# Patient Record
Sex: Male | Born: 1960 | Race: White | Hispanic: No | Marital: Married | Smoking: Never smoker
Health system: Southern US, Community
[De-identification: ages and names within clinical notes are randomized; demographics above are authoritative.]

## PROBLEM LIST (undated history)

## (undated) DIAGNOSIS — M1990 Unspecified osteoarthritis, unspecified site: Secondary | ICD-10-CM

## (undated) DIAGNOSIS — K219 Gastro-esophageal reflux disease without esophagitis: Secondary | ICD-10-CM

## (undated) DIAGNOSIS — T7840XA Allergy, unspecified, initial encounter: Secondary | ICD-10-CM

## (undated) DIAGNOSIS — G510 Bell's palsy: Secondary | ICD-10-CM

## (undated) DIAGNOSIS — K449 Diaphragmatic hernia without obstruction or gangrene: Secondary | ICD-10-CM

## (undated) DIAGNOSIS — Z9289 Personal history of other medical treatment: Secondary | ICD-10-CM

## (undated) DIAGNOSIS — D649 Anemia, unspecified: Secondary | ICD-10-CM

## (undated) HISTORY — DX: Gastro-esophageal reflux disease without esophagitis: K21.9

## (undated) HISTORY — DX: Allergy, unspecified, initial encounter: T78.40XA

## (undated) HISTORY — DX: Unspecified osteoarthritis, unspecified site: M19.90

## (undated) HISTORY — DX: Diaphragmatic hernia without obstruction or gangrene: K44.9

## (undated) HISTORY — PX: WISDOM TOOTH EXTRACTION: SHX21

## (undated) HISTORY — PX: COLONOSCOPY: SHX174

## (undated) HISTORY — DX: Bell's palsy: G51.0

## (undated) HISTORY — DX: Personal history of other medical treatment: Z92.89

---

## 2012-04-06 ENCOUNTER — Ambulatory Visit (INDEPENDENT_AMBULATORY_CARE_PROVIDER_SITE_OTHER): Payer: BC Managed Care – PPO | Admitting: Emergency Medicine

## 2012-04-06 VITALS — BP 142/75 | HR 66 | Temp 98.2°F | Resp 16 | Ht 74.4 in | Wt 304.0 lb

## 2012-04-06 DIAGNOSIS — M2390 Unspecified internal derangement of unspecified knee: Secondary | ICD-10-CM

## 2012-04-06 MED ORDER — NAPROXEN SODIUM 550 MG PO TABS: 550.0000 mg | ORAL_TABLET | Freq: Two times a day (BID) | ORAL | Status: AC

## 2012-04-06 MED ORDER — TRAMADOL HCL 50 MG PO TABS
50.0000 mg | ORAL_TABLET | Freq: Three times a day (TID) | ORAL | Status: AC | PRN
Start: 2012-04-06 — End: 2012-04-16

## 2012-04-06 NOTE — Progress Notes (Signed)
  Subjective:    Patient ID: Jeffery Phelps, male    DOB: May 11, 1961, 51 y.o.   MRN: [id number]  Knee Pain  The incident occurred 3 to 5 days ago. The incident occurred at home. The injury mechanism was a twisting injury. The pain is present in the left knee. The quality of the pain is described as aching and burning. The pain is at a severity of 4/10. The pain is moderate. The pain has been improving since onset. Associated symptoms include an inability to bear weight and a loss of motion.      Review of Systems  Constitutional: Negative.   HENT: Negative.   Eyes: Negative.   Respiratory: Negative.   Cardiovascular: Negative.   Gastrointestinal: Negative.   Genitourinary: Negative.   Musculoskeletal: Positive for joint swelling.  Skin: Negative.   Neurological: Negative.        Objective:   Physical Exam  Constitutional: He is oriented to person, place, and time. He appears well-developed and well-nourished.  HENT:  Head: Normocephalic and atraumatic.  Eyes: EOM are normal. Pupils are equal, round, and reactive to light.  Neck: Normal range of motion.  Cardiovascular: Normal rate and regular rhythm.   Pulmonary/Chest: Effort normal.  Musculoskeletal: He exhibits tenderness (knee full ROM with some pain and effusion. no change with valgus or varus stress.   pain in lateral meniscus).  Neurological: He is alert and oriented to person, place, and time.  Skin: Skin is warm and dry.          Assessment & Plan:

## 2012-04-06 NOTE — Patient Instructions (Signed)
Knee Sprain  You have a knee sprain. Sprains are painful injuries to the joints. A sprain is a partial or complete tearing of ligaments. Ligaments are tough, fibrous tissues that hold bones together at the joints. A strain (sprain) has occurred when a ligament is stretched or damaged. This injury may take several weeks to heal. This is often the same length of time as a bone fracture (break in bone) takes to heal. Even though a fracture (bone break) may not have occurred, the recovery times may be similar.  HOME CARE INSTRUCTIONS   · Rest the injured area for as long as directed by your caregiver. Then slowly start using the joint as directed by your caregiver and as the pain allows. Use crutches as directed. If the knee was splinted or casted, continue use and care as directed. If an ace bandage has been applied today, it should be removed and reapplied every 3 to 4 hours. It should not be applied tightly, but firmly enough to keep swelling down. Watch toes and feet for swelling, bluish discoloration, coldness, numbness or excessive pain. If any of these symptoms occur, remove the ace bandage and reapply more loosely.If these symptoms persist, seek medical attention.  · For the first 24 hours, lie down. Keep the injured extremity elevated on two pillows.  · Apply ice to the injured area for 15 to 20 minutes every couple hours. Repeat this 3 to 4 times per day for the first 48 hours. Put the ice in a plastic bag and place a towel between the bag of ice and your skin.  · Wear any splinting, casting, or elastic bandage applications as instructed.  · Only take over-the-counter or prescription medicines for pain, discomfort, or fever as directed by your caregiver. Do not use aspirin immediately after the injury unless instructed by your caregiver. Aspirin can cause increased bleeding and bruising of the tissues.  · If you were given crutches, continue to use them as instructed. Do not resume weight bearing on the  affected extremity until instructed.  Persistent pain and inability to use the injured area as directed for more than 2 to 3 days are warning signs. If this happens you should see a caregiver for a follow-up visit as soon as possible. Initially, a hairline fracture (this is the same as a broken bone) may not be evident on x-rays. Persistent pain and swelling indicate that further evaluation, non-weight bearing (use of crutches as instructed), and/or further x-rays are indicated. X-rays may sometimes not show a small fracture until a week or ten days later. Make a follow-up appointment with your own caregiver or one to whom we have referred you. A radiologist (specialist in reading x-rays) may re-read your X-rays. Make sure you know how you are to get your x-ray results. Do not assume everything is normal if you do not hear from us.  SEEK MEDICAL CARE IF:   · Bruising, swelling, or pain increases.  · You have cold or numb toes  · You have continuing difficulty or pain with walking.  SEEK IMMEDIATE MEDICAL CARE IF:   · Your toes are cold, numb or blue.  · The pain is not responding to medications and continues to stay the same or get worse.  MAKE SURE YOU:   · Understand these instructions.  · Will watch your condition.  · Will get help right away if you are not doing well or get worse.  Document Released: 10/14/2005 Document Revised: 10/03/2011 Document Reviewed: 09/28/2007    ExitCare® Patient Information ©2012 [employer].

## 2012-04-08 ENCOUNTER — Inpatient Hospital Stay (HOSPITAL_COMMUNITY): Admission: RE | Admit: 2012-04-08 | Payer: Self-pay | Source: Ambulatory Visit

## 2012-04-10 NOTE — Addendum Note (Signed)
Addended by: Carmelina Dane on: 04/10/2012 01:11 PM   Modules accepted: Orders

## 2013-04-09 ENCOUNTER — Ambulatory Visit (INDEPENDENT_AMBULATORY_CARE_PROVIDER_SITE_OTHER): Payer: BC Managed Care – PPO | Admitting: Internal Medicine

## 2013-04-09 VITALS — BP 138/70 | HR 79 | Temp 97.8°F | Resp 16 | Ht 74.5 in | Wt 312.0 lb

## 2013-04-09 DIAGNOSIS — G510 Bell's palsy: Secondary | ICD-10-CM

## 2013-04-09 DIAGNOSIS — R29810 Facial weakness: Secondary | ICD-10-CM

## 2013-04-09 DIAGNOSIS — E669 Obesity, unspecified: Secondary | ICD-10-CM

## 2013-04-09 LAB — POCT CBC
Granulocyte percent: 70.8 %G (ref 37–80)
Hemoglobin: 12 g/dL — AB (ref 14.1–18.1)
MCV: 95 fL (ref 80–97)
MID (cbc): 0.3 (ref 0–0.9)
MPV: 9.2 fL (ref 0–99.8)
POC MID %: 6.1 %M (ref 0–12)
Platelet Count, POC: 163 10*3/uL (ref 142–424)
RBC: 4.18 M/uL — AB (ref 4.69–6.13)

## 2013-04-09 LAB — COMPREHENSIVE METABOLIC PANEL
CO2: 25 mEq/L (ref 19–32)
Calcium: 9.1 mg/dL (ref 8.4–10.5)
Chloride: 106 mEq/L (ref 96–112)
Glucose, Bld: 131 mg/dL — ABNORMAL HIGH (ref 70–99)
Sodium: 139 mEq/L (ref 135–145)
Total Bilirubin: 0.5 mg/dL (ref 0.3–1.2)
Total Protein: 6.9 g/dL (ref 6.0–8.3)

## 2013-04-09 LAB — PSA: PSA: 0.26 ng/mL (ref <=4.00)

## 2013-04-09 LAB — LIPID PANEL: HDL: 48 mg/dL (ref >39)

## 2013-04-09 LAB — TSH: TSH: 0.829 u[IU]/mL (ref 0.350–4.500)

## 2013-04-09 MED ORDER — PREDNISONE 20 MG PO TABS: ORAL_TABLET | ORAL | Status: DC

## 2013-04-09 MED ORDER — VALACYCLOVIR HCL 1 G PO TABS
1000.0000 mg | ORAL_TABLET | Freq: Three times a day (TID) | ORAL | Status: DC
Start: 2013-04-09 — End: 2014-01-26

## 2013-04-09 NOTE — Patient Instructions (Signed)
Bell's Palsy  Bell's palsy is a condition in which the muscles on one side of the face cannot move (paralysis). This is because the nerves in the face are paralyzed. It is most often thought to be caused by a virus. The virus causes swelling of the nerve that controls movement on one side of the face. The nerve travels through a tight space surrounded by bone. When the nerve swells, it can be compressed by the bone. This results in damage to the protective covering around the nerve. This damage interferes with how the nerve communicates with the muscles of the face. As a result, it can cause weakness or paralysis of the facial muscles.   Injury (trauma), tumor, and surgery may cause Bell's palsy, but most of the time the cause is unknown. It is a relatively common condition. It starts suddenly (abrupt onset) with the paralysis usually ending within 2 days. Bell's palsy is not dangerous. But because the eye does not close properly, you may need care to keep the eye from getting dry. This can include splinting (to keep the eye shut) or moistening with artificial tears. Bell's palsy very seldom occurs on both sides of the face at the same time.  SYMPTOMS    Eyebrow sagging.   Drooping of the eyelid and corner of the mouth.   Inability to close one eye.   Loss of taste on the front of the tongue.   Sensitivity to loud noises.  TREATMENT   The treatment is usually non-surgical. If the patient is seen within the first 24 to 48 hours, a short course of steroids may be prescribed, in an attempt to shorten the length of the condition. Antiviral medicines may also be used with the steroids, but it is unclear if they are helpful.   You will need to protect your eye, if you cannot close it. The cornea (clear covering over your eye) will become dry and can be damaged. Artificial tears can be used to keep your eye moist. Glasses or an eye patch should be worn to protect your eye.  PROGNOSIS   Recovery is variable, ranging  from days to months. Although the problem usually goes away completely (about 80% of cases resolve), predicting the outcome is impossible. Most people improve within 3 weeks of when the symptoms began. Improvement may continue for 3 to 6 months. A small number of people have moderate to severe weakness that is permanent.   HOME CARE INSTRUCTIONS    If your caregiver prescribed medication to reduce swelling in the nerve, use as directed. Do not stop taking the medication unless directed by your caregiver.   Use moisturizing eye drops as needed to prevent drying of your eye, as directed by your caregiver.   Protect your eye, as directed by your caregiver.   Use facial massage and exercises, as directed by your caregiver.   Perform your normal activities, and get your normal rest.  SEEK IMMEDIATE MEDICAL CARE IF:    There is pain, redness or irritation in the eye.   You or your child has an oral temperature above 102 F (38.9 C), not controlled by medicine.  MAKE SURE YOU:    Understand these instructions.   Will watch your condition.   Will get help right away if you are not doing well or get worse.  Document Released: 10/14/2005 Document Revised: 01/06/2012 Document Reviewed: 10/23/2009  ExitCare Patient Information 2014 [employer].

## 2013-04-09 NOTE — Progress Notes (Signed)
Subjective:    Patient ID: Jeffery Phelps, male    DOB: [birth year], 52 y.o.   MRN: [id number]  HPI Patient states that yesterday around noon the right side of his face stopped working, facial dropping.  Right ear also feels congested.  Patient also complaining of left eye twitches off and on since yesterday afternoon.  Denies SOB, Chest pain, no balance problems. Denies hearing changes, pain, dizziness.  No history of heart problems or heart irregularity.  Patient does not have a cardiologist.  Patient does not smoke, denies any medical conditions.   Has no MD and no hx of complete exam or any lab work done. Is morbid obese. No hx of DM, HTN ,or cholesterol issues.  Patient had a slight headache yesterday.   Review of Systems Arthritis and allergys    Objective:   Physical Exam  Vitals reviewed. Constitutional: He is oriented to person, place, and time. He appears well-nourished. No distress.  HENT:  Right Ear: External ear normal.  Left Ear: External ear normal.  Nose: Nose normal.  Mouth/Throat: Oropharynx is clear and moist.  Eyes: Conjunctivae and EOM are normal. Pupils are equal, round, and reactive to light.  Neck: Normal range of motion. Neck supple. No tracheal deviation present. No thyromegaly present.  Cardiovascular: Normal rate, normal heart sounds and intact distal pulses.   Pulmonary/Chest: Effort normal and breath sounds normal.  Abdominal: Soft. Bowel sounds are normal. There is no tenderness.  Musculoskeletal: Normal range of motion.  Lymphadenopathy:    He has no cervical adenopathy.  Neurological: He is alert and oriented to person, place, and time. He has normal reflexes. He displays normal reflexes. A cranial nerve deficit is present. No sensory deficit. He exhibits abnormal muscle tone. He displays a negative Romberg sign. Coordination and gait normal. He displays no Babinski's sign on the right side. He displays no Babinski's sign on the left side.  Reflex  Scores:      Tricep reflexes are 2+ on the right side and 2+ on the left side.      Patellar reflexes are 2+ on the right side and 2+ on the left side. Balance intact No drift  Skin: No rash noted.  Psychiatric: He has a normal mood and affect. His behavior is normal. Judgment and thought content normal.   Right facial weakness obvious  EKG normal Results for orders placed in visit on 04/09/13  POCT CBC      Result Value Range   WBC 4.4 (*) 4.6 - 10.2 K/uL   Lymph, poc 1.0  0.6 - 3.4   POC LYMPH PERCENT 23.1  10 - 50 %L   MID (cbc) 0.3  0 - 0.9   POC MID % 6.1  0 - 12 %M   POC Granulocyte 3.1  2 - 6.9   Granulocyte percent 70.8  37 - 80 %G   RBC 4.18 (*) 4.69 - 6.13 M/uL   Hemoglobin 12.0 (*) 14.1 - 18.1 g/dL   HCT, POC 40.9 (*) 81.1 - 53.7 %   MCV 95.0  80 - 97 fL   MCH, POC 28.7  27 - 31.2 pg   MCHC 30.2 (*) 31.8 - 35.4 g/dL   RDW, POC 91.4     Platelet Count, POC 163  142 - 424 K/uL   MPV 9.2  0 - 99.8 fL  GLUCOSE, POCT (MANUAL RESULT ENTRY)      Result Value Range   POC Glucose 124 (*) 70 - 99 mg/dl  POCT GLYCOSYLATED HEMOGLOBIN (HGB A1C)      Result Value Range   Hemoglobin A1C 5.5         Assessment & Plan:  Acute Bells Palsy Start Prednisone and valtrex EYE care/patch/lacrlube Refer to ENT

## 2014-01-10 ENCOUNTER — Ambulatory Visit: Payer: BC Managed Care – PPO

## 2014-01-10 ENCOUNTER — Ambulatory Visit (INDEPENDENT_AMBULATORY_CARE_PROVIDER_SITE_OTHER): Payer: BC Managed Care – PPO | Admitting: Emergency Medicine

## 2014-01-10 VITALS — BP 140/62 | HR 74 | Temp 98.0°F | Resp 16 | Ht 72.0 in | Wt 309.0 lb

## 2014-01-10 DIAGNOSIS — Z1211 Encounter for screening for malignant neoplasm of colon: Secondary | ICD-10-CM

## 2014-01-10 DIAGNOSIS — R079 Chest pain, unspecified: Secondary | ICD-10-CM

## 2014-01-10 DIAGNOSIS — S20219A Contusion of unspecified front wall of thorax, initial encounter: Secondary | ICD-10-CM

## 2014-01-10 MED ORDER — ACETAMINOPHEN-CODEINE #3 300-30 MG PO TABS: 1.0000 | ORAL_TABLET | ORAL | Status: DC | PRN
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
## 2014-01-10 NOTE — Patient Instructions (Signed)
Chest Contusion °A chest contusion is a deep bruise on your chest area. Contusions are the result of an injury that caused bleeding under the skin. A chest contusion may involve bruising of the skin, muscles, or ribs. The contusion may turn blue, purple, or yellow. Minor injuries will give you a painless contusion, but more severe contusions may stay painful and swollen for a few weeks. °CAUSES  °A contusion is usually caused by a blow, trauma, or direct force to an area of the body. °SYMPTOMS  °· Swelling and redness of the injured area. °· Discoloration of the injured area. °· Tenderness and soreness of the injured area. °· Pain. °DIAGNOSIS  °The diagnosis can be made by taking a history and performing a physical exam. An X-ray, CT scan, or MRI may be needed to determine if there were any associated injuries, such as broken bones (fractures) or internal injuries. °TREATMENT  °Often, the best treatment for a chest contusion is resting, icing, and applying cold compresses to the injured area. Deep breathing exercises may be recommended to reduce the risk of pneumonia. Over-the-counter medicines may also be recommended for pain control. °HOME CARE INSTRUCTIONS  °· Put ice on the injured area. °· Put ice in a plastic bag. °· Place a towel between your skin and the bag. °· Leave the ice on for 15-20 minutes, 03-04 times a day. °· Only take over-the-counter or prescription medicines as directed by your caregiver. Your caregiver may recommend avoiding anti-inflammatory medicines (aspirin, ibuprofen, and naproxen) for 48 hours because these medicines may increase bruising. °· Rest the injured area. °· Perform deep-breathing exercises as directed by your caregiver. °· Stop smoking if you smoke. °· Do not lift objects over 5 pounds (2.3 kg) for 3 days or longer if recommended by your caregiver. °SEEK IMMEDIATE MEDICAL CARE IF:  °· You have increased bruising or swelling. °· You have pain that is getting worse. °· You have  difficulty breathing. °· You have dizziness, weakness, or fainting. °· You have blood in your urine or stool. °· You cough up or vomit blood. °· Your swelling or pain is not relieved with medicines. °MAKE SURE YOU:  °· Understand these instructions. °· Will watch your condition. °· Will get help right away if you are not doing well or get worse. °Document Released: 07/09/2001 Document Revised: 07/08/2012 Document Reviewed: 04/06/2012 °ExitCare® Patient Information ©2014 [employer]. ° °

## 2014-01-12 ENCOUNTER — Encounter: Payer: Self-pay | Admitting: Internal Medicine

## 2014-01-19 ENCOUNTER — Telehealth: Payer: Self-pay
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
## 2014-01-19 NOTE — Telephone Encounter (Signed)
Spoke to pts wife, wife wants him to seek additional tx, side has begun hurting a couple of days later, wifes opinion - she wants dr Ouida Sills to persuade him to have further testing.  i advised her to bring her husband in for further evaluation due to new pain in side. Dr. Sharyon Cable hours were given to pts wife.  I instructed her to discuss her concerns with the doctor at that time.

## 2014-01-20 ENCOUNTER — Ambulatory Visit (INDEPENDENT_AMBULATORY_CARE_PROVIDER_SITE_OTHER): Payer: BC Managed Care – PPO | Admitting: Internal Medicine

## 2014-01-20 VITALS — BP 144/78 | HR 73 | Temp 98.4°F | Resp 18 | Ht 72.0 in | Wt 306.0 lb

## 2014-01-20 DIAGNOSIS — R079 Chest pain, unspecified: Secondary | ICD-10-CM

## 2014-01-20 DIAGNOSIS — M546 Pain in thoracic spine: Secondary | ICD-10-CM

## 2014-01-20 DIAGNOSIS — S20219A Contusion of unspecified front wall of thorax, initial encounter: Secondary | ICD-10-CM

## 2014-01-20 DIAGNOSIS — Z1211 Encounter for screening for malignant neoplasm of colon: Secondary | ICD-10-CM

## 2014-01-20 NOTE — Patient Instructions (Signed)
Chest Contusion °A chest contusion is a deep bruise on your chest area. Contusions are the result of an injury that caused bleeding under the skin. A chest contusion may involve bruising of the skin, muscles, or ribs. The contusion may turn blue, purple, or yellow. Minor injuries will give you a painless contusion, but more severe contusions may stay painful and swollen for a few weeks. °CAUSES  °A contusion is usually caused by a blow, trauma, or direct force to an area of the body. °SYMPTOMS  °· Swelling and redness of the injured area. °· Discoloration of the injured area. °· Tenderness and soreness of the injured area. °· Pain. °DIAGNOSIS  °The diagnosis can be made by taking a history and performing a physical exam. An X-ray, CT scan, or MRI may be needed to determine if there were any associated injuries, such as broken bones (fractures) or internal injuries. °TREATMENT  °Often, the best treatment for a chest contusion is resting, icing, and applying cold compresses to the injured area. Deep breathing exercises may be recommended to reduce the risk of pneumonia. Over-the-counter medicines may also be recommended for pain control. °HOME CARE INSTRUCTIONS  °· Put ice on the injured area. °· Put ice in a plastic bag. °· Place a towel between your skin and the bag. °· Leave the ice on for 15-20 minutes, 03-04 times a day. °· Only take over-the-counter or prescription medicines as directed by your caregiver. Your caregiver may recommend avoiding anti-inflammatory medicines (aspirin, ibuprofen, and naproxen) for 48 hours because these medicines may increase bruising. °· Rest the injured area. °· Perform deep-breathing exercises as directed by your caregiver. °· Stop smoking if you smoke. °· Do not lift objects over 5 pounds (2.3 kg) for 3 days or longer if recommended by your caregiver. °SEEK IMMEDIATE MEDICAL CARE IF:  °· You have increased bruising or swelling. °· You have pain that is getting worse. °· You have  difficulty breathing. °· You have dizziness, weakness, or fainting. °· You have blood in your urine or stool. °· You cough up or vomit blood. °· Your swelling or pain is not relieved with medicines. °MAKE SURE YOU:  °· Understand these instructions. °· Will watch your condition. °· Will get help right away if you are not doing well or get worse. °Document Released: 07/09/2001 Document Revised: 07/08/2012 Document Reviewed: 04/06/2012 °ExitCare® Patient Information ©2014 [employer]. ° °
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
## 2014-01-26 ENCOUNTER — Ambulatory Visit (AMBULATORY_SURGERY_CENTER): Payer: Self-pay

## 2014-01-26 VITALS — Ht 72.0 in | Wt 310.0 lb

## 2014-01-26 DIAGNOSIS — Z800 Family history of malignant neoplasm of digestive organs: Secondary | ICD-10-CM

## 2014-01-26 MED ORDER — SUPREP BOWEL PREP KIT 17.5-3.13-1.6 GM/177ML PO SOLN: 1.0000 | Freq: Once | ORAL | Status: DC

## 2014-01-28 ENCOUNTER — Encounter: Payer: Self-pay | Admitting: Internal Medicine

## 2014-02-09 ENCOUNTER — Encounter: Payer: Self-pay | Admitting: Internal Medicine

## 2014-02-17 ENCOUNTER — Encounter: Payer: Self-pay | Admitting: Internal Medicine

## 2014-02-17 ENCOUNTER — Ambulatory Visit (AMBULATORY_SURGERY_CENTER): Payer: BC Managed Care – PPO | Admitting: Internal Medicine

## 2014-02-17 VITALS — BP 94/49 | HR 53 | Temp 97.4°F | Resp 17 | Ht 72.0 in | Wt 310.0 lb

## 2014-02-17 DIAGNOSIS — D126 Benign neoplasm of colon, unspecified: Secondary | ICD-10-CM

## 2014-02-17 DIAGNOSIS — Z800 Family history of malignant neoplasm of digestive organs: Secondary | ICD-10-CM

## 2014-02-17 DIAGNOSIS — Z1211 Encounter for screening for malignant neoplasm of colon: Secondary | ICD-10-CM

## 2014-02-17 MED ORDER — SODIUM CHLORIDE 0.9 % IV SOLN: 500.0000 mL | INTRAVENOUS | Status: DC

## 2014-02-17 NOTE — Op Note (Signed)
[place]  [address] [place], [address]   COLONOSCOPY PROCEDURE REPORT  PATIENT: Jeffery Phelps, Jeffery Phelps  MR#: [id number] BIRTHDATE: [birth year] , 53  yrs. old GENDER: Male ENDOSCOPIST: Gatha Mayer, MD, [facility] REFERRED BY:   Synetta Shadow, M.D. PROCEDURE DATE:  02/17/2014 PROCEDURE:   Colonoscopy with biopsy First Screening Colonoscopy - Avg.  risk and is 50 yrs.  old or older - No.  Prior Negative Screening - Now for repeat screening. N/A  History of Adenoma - Now for follow-up colonoscopy & has been > or = to 3 yrs.  N/A  Polyps Removed Today? Yes. ASA CLASS:   Class II INDICATIONS:elevated risk screening and Patient's immediate family history of colon cancer.   Mom age 93 MEDICATIONS: propofol (Diprivan) 250mg  IV, MAC sedation, administered by CRNA, and These medications were titrated to patient response per physician's verbal order  DESCRIPTION OF PROCEDURE:   After the risks benefits and alternatives of the procedure were thoroughly explained, informed consent was obtained.  A digital rectal exam revealed no abnormalities of the rectum, A digital rectal exam revealed no prostatic nodules, and A digital rectal exam revealed the prostate was not enlarged.   The [id number]  endoscope was introduced through the anus and advanced to the cecum, which was identified by both the appendix and ileocecal valve. No adverse events experienced.   The quality of the prep was excellent using Suprep  The instrument was then slowly withdrawn as the colon was fully examined.      COLON FINDINGS: A sessile polyp measuring 2 mm in size was found in the descending colon.  A polypectomy was performed with cold forceps.  The resection was complete and the polyp tissue was completely retrieved.   The colon mucosa was otherwise normal. Retroflexed views revealed no abnormalities. The time to cecum=2 minutes 54 seconds.  Withdrawal time=9 minutes 0 seconds.   The scope was withdrawn and the procedure completed. COMPLICATIONS: There were no complications.  ENDOSCOPIC IMPRESSION: 1.   Sessile polyp measuring 2 mm in size was found in the descending colon; polypectomy was performed with cold forceps 2.   The colon mucosa was otherwise normal - excellent prep  RECOMMENDATIONS: Repeat Colonoscopy in 5 years anticipated (FHx CRCA)   eSigned:  Gatha Mayer, MD, [facility] 02/17/2014 9:56 AM   cc: Synetta Shadow, MD and The Patient

## 2014-02-17 NOTE — Patient Instructions (Addendum)
One tiny polyp was removed - it looks benign.  Everything else was normal.Suspect next colonoscopy in 5 years.  I will let you know pathology results and when to have another routine colonoscopy by mail.  I appreciate the opportunity to care for you. Gatha Mayer, MD, FACG  YOU HAD AN ENDOSCOPIC PROCEDURE TODAY AT [facility]: Refer to the procedure report that was given to you for any specific questions about what was found during the examination.  If the procedure report does not answer your questions, please call your gastroenterologist to clarify.  If you requested that your care partner not be given the details of your procedure findings, then the procedure report has been included in a sealed envelope for you to review at your convenience later.  YOU SHOULD EXPECT: Some feelings of bloating in the abdomen. Passage of more gas than usual.  Walking can help get rid of the air that was put into your GI tract during the procedure and reduce the bloating. If you had a lower endoscopy (such as a colonoscopy or flexible sigmoidoscopy) you may notice spotting of blood in your stool or on the toilet paper. If you underwent a bowel prep for your procedure, then you may not have a normal bowel movement for a few days.  DIET: Your first meal following the procedure should be a light meal and then it is ok to progress to your normal diet.  A half-sandwich or bowl of soup is an example of a good first meal.  Heavy or fried foods are harder to digest and may make you feel nauseous or bloated.  Likewise meals heavy in dairy and vegetables can cause extra gas to form and this can also increase the bloating.  Drink plenty of fluids but you should avoid alcoholic beverages for 24 hours.  ACTIVITY: Your care partner should take you home directly after the procedure.  You should plan to take it easy, moving slowly for the rest of the day.  You can resume normal activity the day after the  procedure however you should NOT DRIVE or use heavy machinery for 24 hours (because of the sedation medicines used during the test).    SYMPTOMS TO REPORT IMMEDIATELY: A gastroenterologist can be reached at any hour.  During normal business hours, 8:30 AM to 5:00 PM Monday through Friday, call [phone].  After hours and on weekends, please call the GI answering service at [phone] who will take a message and have the physician on call contact you.   Following lower endoscopy (colonoscopy or flexible sigmoidoscopy):  Excessive amounts of blood in the stool  Significant tenderness or worsening of abdominal pains  Swelling of the abdomen that is new, acute  Fever of 100F or higher  FOLLOW UP: If any biopsies were taken you will be contacted by phone or by letter within the next 1-3 weeks.  Call your gastroenterologist if you have not heard about the biopsies in 3 weeks.  Our staff will call the home number listed on your records the next business day following your procedure to check on you and address any questions or concerns that you may have at that time regarding the information given to you following your procedure. This is a courtesy call and so if there is no answer at the home number and we have not heard from you through the emergency physician on call, we will assume that you have returned to your regular daily activities without  incident.  SIGNATURES/CONFIDENTIALITY: You and/or your care partner have signed paperwork which will be entered into your electronic medical record.  These signatures attest to the fact that that the information above on your After Visit Summary has been reviewed and is understood.  Full responsibility of the confidentiality of this discharge information lies with you and/or your care-partner.

## 2014-02-17 NOTE — Progress Notes (Signed)
Called to room to assist during endoscopic procedure.  Patient ID and intended procedure confirmed with present staff. Received instructions for my participation in the procedure from the performing physician.  

## 2014-02-18 ENCOUNTER — Telehealth: Payer: Self-pay | Admitting: *Deleted

## 2014-02-18 NOTE — Telephone Encounter (Signed)
LMOM.  Patient identifier.  Advised patient to call back if any questions or concerns.

## 2014-02-23 ENCOUNTER — Encounter: Payer: Self-pay | Admitting: Internal Medicine

## 2014-07-25 ENCOUNTER — Telehealth: Payer: Self-pay
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
## 2014-07-28 NOTE — Telephone Encounter (Signed)
Spoke to pt- he states he will be in one day next week.

## 2014-07-28 NOTE — Telephone Encounter (Signed)
He will need an ov so we can re-examine him.

## 2014-08-17 ENCOUNTER — Ambulatory Visit (INDEPENDENT_AMBULATORY_CARE_PROVIDER_SITE_OTHER): Payer: BC Managed Care – PPO | Admitting: Family Medicine

## 2014-08-17 ENCOUNTER — Ambulatory Visit (INDEPENDENT_AMBULATORY_CARE_PROVIDER_SITE_OTHER): Payer: BC Managed Care – PPO

## 2014-08-17 VITALS — BP 150/64 | HR 75 | Temp 98.4°F | Resp 18 | Ht 75.0 in | Wt 312.0 lb

## 2014-08-17 DIAGNOSIS — R0789 Other chest pain: Secondary | ICD-10-CM

## 2014-08-17 DIAGNOSIS — R071 Chest pain on breathing: Secondary | ICD-10-CM
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
## 2014-08-18 ENCOUNTER — Encounter: Payer: Self-pay | Admitting: Physician Assistant

## 2014-08-23 ENCOUNTER — Telehealth: Payer: Self-pay

## 2014-08-23 NOTE — Telephone Encounter (Signed)
Patient left voicemail this morning at 8:35am requesting billing statements and records related to office visit last Wednesday (08/17/14). Please call when ready at [phone].

## 2014-08-25 NOTE — Telephone Encounter (Signed)
Records ready for pick up and patient notified. He will sign ROI form when he comes to the office this morning.

## 2014-08-25 NOTE — Telephone Encounter (Signed)
Patient left another voicemail today at 8:55am requesting a cal back. Will call him today.

## 2014-10-19 ENCOUNTER — Ambulatory Visit (INDEPENDENT_AMBULATORY_CARE_PROVIDER_SITE_OTHER): Payer: BC Managed Care – PPO | Admitting: Family Medicine

## 2014-10-19 VITALS — BP 130/80 | HR 90 | Temp 98.8°F | Resp 17 | Ht 71.5 in | Wt 308.0 lb

## 2014-10-19 DIAGNOSIS — J069 Acute upper respiratory infection, unspecified: Secondary | ICD-10-CM

## 2014-10-19 DIAGNOSIS — R05 Cough: Secondary | ICD-10-CM

## 2014-10-19 DIAGNOSIS — R059 Cough, unspecified: Secondary | ICD-10-CM

## 2014-10-19 MED ORDER — BENZONATATE 100 MG PO CAPS: 100.0000 mg | ORAL_CAPSULE | Freq: Three times a day (TID) | ORAL | Status: DC | PRN

## 2014-10-19 MED ORDER — HYDROCODONE-HOMATROPINE 5-1.5 MG/5ML PO SYRP: 5.0000 mL | ORAL_SOLUTION | ORAL | Status: DC | PRN

## 2014-10-19 MED ORDER — AMOXICILLIN 875 MG PO TABS: 875.0000 mg | ORAL_TABLET | Freq: Two times a day (BID) | ORAL | Status: DC

## 2014-10-19 NOTE — Patient Instructions (Signed)
Drink plenty of fluids and get enough rest  Take the amoxicillin one twice daily for infection  Take the Hycodan cough syrup 1 teaspoon every 4-6 hours as needed for cough. It will cause some drowsiness.  Additionally I'm giving you some Tessalon (benzonatate) cough pills which she can take one or 23 times a day as needed for cough. These are not as good as the cough syrup, do not cause sedation.  Return if worse or not improving

## 2014-10-19 NOTE — Progress Notes (Signed)
Subjective: 53 year old man who has been having a respiratory tract infection for the past week or so. He had a sore throat and hoarseness, then developed more chest congestion. He's been coughing all week. He has taken various over-the-counter medications. Has not been feeling well, but was off work anyhow this week. He does not smoke. He is bringing up some phlegm, but not enough. He has coughed a lot during the nighttime. His wife and child were also sick, but they're doing better.  Objective: TMs are normal. Throat not erythematous. Neck supple without significant nodes. Chest is clear to auscultation. Heart regular without murmurs.   Assessment: Upper respiratory infection with secondary persisting cough  Plan: It is been a week since this started. It still may be all viral, but at this point since it does not seem to be clearing will go ahead and give him a round of antibiotics and some prescription cough medications. No labs today. Return if worse.

## 2016-08-26 ENCOUNTER — Ambulatory Visit (INDEPENDENT_AMBULATORY_CARE_PROVIDER_SITE_OTHER): Payer: Worker's Compensation | Admitting: Family Medicine

## 2016-08-26 ENCOUNTER — Ambulatory Visit (INDEPENDENT_AMBULATORY_CARE_PROVIDER_SITE_OTHER): Payer: Worker's Compensation

## 2016-08-26 VITALS — BP 168/70 | HR 70 | Temp 98.5°F | Resp 18 | Ht 72.0 in | Wt 333.0 lb

## 2016-08-26 DIAGNOSIS — S82832A Other fracture of upper and lower end of left fibula, initial encounter for closed fracture: Secondary | ICD-10-CM | POA: Diagnosis not present

## 2016-08-26 DIAGNOSIS — Z026 Encounter for examination for insurance purposes: Secondary | ICD-10-CM

## 2016-08-26 DIAGNOSIS — M25572 Pain in left ankle and joints of left foot: Secondary | ICD-10-CM

## 2016-08-26 MED ORDER — TRAMADOL HCL 50 MG PO TABS: 50.0000 mg | ORAL_TABLET | Freq: Three times a day (TID) | ORAL | 0 refills | Status: DC | PRN

## 2016-08-26 NOTE — Progress Notes (Signed)
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   Chief Complaint  Patient presents with  . Ankle Injury    Left Ankle  . Drug Screen    Workers comp     Date of Injury: 08/26/16  History of Present Illness: [place] for evaluation of work-related complaint.  55 year old male presents today with left ankle pain following a fall which occurred at his place of employment today.  At 6:45 am he was walking with a box in his hand and down a ramp which he recalls as "slippery" and proceeded fall while on the ramp. His left ankle everted and his complete body weight landed on his left leg and foot. He immediately reported incident to his supervisor. He was able to stand and ambulate without incident. He was wearing safety boots at time of fall. Denies any dizziness or loss of consciousness preceding or after fall. Reports that he did not sustain a head injury.  ROS SEE HPI Current medications and allergies reviewed and updated. Past medical history, family history, social history have been reviewed and updated.   Physical Exam  Constitutional: He is oriented to person, place, and time and well-developed, well-nourished, and in no distress.  HENT:  Head: Normocephalic and atraumatic.  Right Ear: External ear normal.  Left Ear: External ear normal.  Nose: Nose normal.  Eyes: Conjunctivae and EOM are normal. Pupils are equal, round, and reactive to light.  Neck: Normal range of motion. Neck supple.  Cardiovascular: Normal rate, regular rhythm, normal heart sounds and intact distal pulses.   Musculoskeletal: He exhibits edema and tenderness.       Left ankle: He exhibits swelling. He exhibits normal range of motion. Tenderness. Lateral malleolus tenderness found. Achilles tendon exhibits no pain.       Feet:  Neurological: He is alert and oriented to person, place, and time.  Skin: Skin is warm and dry.  Psychiatric: Mood, memory, affect and judgment normal.    Vitals:   08/26/16 0858  BP: (!) 168/70  Pulse: 70  Resp: 18  Temp: 98.5 F (36.9 C)   Assessment and Plan:  1. Encounter related to worker's compensation claim 2. Acute left ankle pain - DG Ankle Complete Left-distal fibula nondisplaced fracture  Plan: -Tramadol 50-100 mg every 8 hours as needed for pain -Apply cam walker boot to left leg -Ambulatory referral to Orthopedics  Return for follow-up as needed.  Carroll Sage. Kenton Kingfisher, MSN, FNP-C [facility]

## 2016-08-26 NOTE — Progress Notes (Deleted)
   Patient ID: Jeffery Phelps, male    DOB: 1960-12-11, 55 y.o.   MRN: [id number]  PCP: No PCP Per Patient  Chief Complaint  Patient presents with  . Ankle Injury    Left Ankle  . Drug Screen    Workers comp    Subjective:   HPI Presents for evaluation of ***.  ***.    Review of Systems     Patient Active Problem List   Diagnosis Date Noted  . Family history of colon cancer - mom age 14 02/17/2014     Prior to Admission medications   Medication Sig Start Date End Date Taking? Authorizing Provider  cetirizine (ZYRTEC) 10 MG tablet Take 10 mg by mouth daily.   Yes Historical Provider, MD  glucosamine-chondroitin 500-400 MG tablet Take 2 tablets by mouth daily.   Yes Historical Provider, MD  loperamide (IMODIUM) 1 MG/5ML solution Take by mouth as needed for diarrhea or loose stools.   Yes Historical Provider, MD  naproxen sodium (ANAPROX) 220 MG tablet Take 220 mg by mouth 2 (two) times daily with a meal.   Yes Historical Provider, MD  ranitidine (ZANTAC) 150 MG tablet Take 150 mg by mouth 2 (two) times daily.   Yes Historical Provider, MD  HYDROcodone-homatropine (HYCODAN) 5-1.5 MG/5ML syrup Take 5 mLs by mouth every 4 (four) hours as needed. Patient not taking: Reported on 08/26/2016 10/19/14   Posey Boyer, MD     No Known Allergies     Objective:  Physical Exam         Assessment & Plan:   ***

## 2016-08-26 NOTE — Patient Instructions (Addendum)
-Tramadol 50-100 mg every 8 hours as needed for pain -If you have heard from orthopedics regarding an appointment within the next 72 hours, please call the office at [phone].  IF you received an x-ray today, you will receive an invoice from [facility]. Please contact [facility] at [phone] with questions or concerns regarding your invoice.   IF you received labwork today, you will receive an invoice from [employer]. Please contact [facility] at [phone] with questions or concerns regarding your invoice.   Our billing staff will not be able to assist you with questions regarding bills from these companies.  You will be contacted with the lab results as soon as they are available. The fastest way to get your results is to activate your My Chart account. Instructions are located on the last page of this paperwork. If you have not heard from [name] regarding the results in 2 weeks, please contact this office.     Ankle Fracture A fracture is a break in a bone. The ankle joint is made up of three bones. These include the lower (distal)sections of your lower leg bones, called the tibia and fibula, along with a bone in your foot, called the talus. Depending on how bad the break is and if more than one ankle joint bone is broken, a cast or splint is used to protect and keep your injured bone from moving while it heals. Sometimes, surgery is required to help the fracture heal properly.  There are two general types of fractures:  Stable fracture. This includes a single fracture line through one bone, with no injury to ankle ligaments. A fracture of the talus that does not have any displacement (movement of the bone on either side of the fracture line) is also stable.  Unstable fracture. This includes more than one fracture line through one or more bones in the ankle joint. It also includes fractures that have displacement of the bone on either side of  the fracture line. CAUSES  A direct blow to the ankle.   Quickly and severely twisting your ankle.  Trauma, such as a car accident or falling from a significant height. RISK FACTORS You may be at a higher risk of ankle fracture if:  You have certain medical conditions.  You are involved in high-impact sports.  You are involved in a high-impact car accident. SIGNS AND SYMPTOMS   Tender and swollen ankle.  Bruising around the injured ankle.  Pain on movement of the ankle.  Difficulty walking or putting weight on the ankle.  A cold foot below the site of the ankle injury. This can occur if the blood vessels passing through your injured ankle were also damaged.  Numbness in the foot below the site of the ankle injury. DIAGNOSIS  An ankle fracture is usually diagnosed with a physical exam and X-rays. A CT scan may also be required for complex fractures. TREATMENT  Stable fractures are treated with a cast or splint and using crutches to avoid putting weight on your injured ankle. This is followed by an ankle strengthening program. Some patients require a special type of cast, depending on other medical problems they may have. Unstable fractures require surgery to ensure the bones heal properly. Your health care provider will tell you what type of fracture you have and the best treatment for your condition. HOME CARE INSTRUCTIONS   Review correct crutch use with your health care provider and use your crutches as directed. Safe use of crutches  is extremely important. Misuse of crutches can cause you to fall or cause injury to nerves in your hands or armpits.  Do not put weight or pressure on the injured ankle until directed by your health care provider.  To lessen the swelling, keep the injured leg elevated while sitting or lying down.  Apply ice to the injured area:  Put ice in a plastic bag.  Place a towel between your cast and the bag.  Leave the ice on for 20 minutes, 2-3  times a day.  If you have a plaster or fiberglass cast:  Do not try to scratch the skin under the cast with any objects. This can increase your risk of skin infection.  Check the skin around the cast every day. You may put lotion on any red or sore areas.  Keep your cast dry and clean.  If you have a plaster splint:  Wear the splint as directed.  You may loosen the elastic around the splint if your toes become numb, tingle, or turn cold or blue.  Do not put pressure on any part of your cast or splint; it may break. Rest your cast only on a pillow the first 24 hours until it is fully hardened.  Your cast or splint can be protected during bathing with a plastic bag sealed to your skin with medical tape. Do not lower the cast or splint into water.  Take medicines as directed by your health care provider. Only take over-the-counter or prescription medicines for pain, discomfort, or fever as directed by your health care provider.  Do not drive a vehicle until your health care provider specifically tells you it is safe to do so.  If your health care provider has given you a follow-up appointment, it is very important to keep that appointment. Not keeping the appointment could result in a chronic or permanent injury, pain, and disability. If you have any problem keeping the appointment, call the facility for assistance. SEEK MEDICAL CARE IF: You develop increased swelling or discomfort. SEEK IMMEDIATE MEDICAL CARE IF:   Your cast gets damaged or breaks.  You have continued severe pain.  You develop new pain or swelling after the cast was put on.  Your skin or toenails below the injury turn blue or gray.  Your skin or toenails below the injury feel cold, numb, or have loss of sensitivity to touch.  There is a bad smell or pus draining from under the cast. MAKE SURE YOU:   Understand these instructions.  Will watch your condition.  Will get help right away if you are not doing  well or get worse.   This information is not intended to replace advice given to you by your health care provider. Make sure you discuss any questions you have with your health care provider.   Document Released: 10/11/2000 Document Revised: 10/19/2013 Document Reviewed: 05/13/2013 Elsevier Interactive Patient Education [employer].

## 2016-08-27 ENCOUNTER — Ambulatory Visit (INDEPENDENT_AMBULATORY_CARE_PROVIDER_SITE_OTHER): Payer: Worker's Compensation | Admitting: Orthopaedic Surgery

## 2016-08-27 ENCOUNTER — Encounter (INDEPENDENT_AMBULATORY_CARE_PROVIDER_SITE_OTHER): Payer: Self-pay | Admitting: Orthopaedic Surgery

## 2016-08-27 ENCOUNTER — Ambulatory Visit (INDEPENDENT_AMBULATORY_CARE_PROVIDER_SITE_OTHER): Payer: Worker's Compensation

## 2016-08-27 VITALS — Ht 72.0 in | Wt 333.0 lb

## 2016-08-27 DIAGNOSIS — M25572 Pain in left ankle and joints of left foot: Secondary | ICD-10-CM

## 2016-08-27 DIAGNOSIS — S8262XA Displaced fracture of lateral malleolus of left fibula, initial encounter for closed fracture: Secondary | ICD-10-CM | POA: Diagnosis not present
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
## 2016-08-28 ENCOUNTER — Other Ambulatory Visit (INDEPENDENT_AMBULATORY_CARE_PROVIDER_SITE_OTHER): Payer: Self-pay | Admitting: Orthopaedic Surgery

## 2016-08-28 ENCOUNTER — Encounter (HOSPITAL_COMMUNITY): Payer: Self-pay | Admitting: *Deleted

## 2016-08-28 DIAGNOSIS — S8262XA Displaced fracture of lateral malleolus of left fibula, initial encounter for closed fracture: Secondary | ICD-10-CM

## 2016-08-28 NOTE — Progress Notes (Signed)
   08/28/16 1442  OBSTRUCTIVE SLEEP APNEA  Have you ever been diagnosed with sleep apnea through a sleep study? No  Do you snore loudly (loud enough to be heard through closed doors)?  1  Do you often feel tired, fatigued, or sleepy during the daytime (such as falling asleep during driving or talking to someone)? 0  Has anyone observed you stop breathing during your sleep? 1  Do you have, or are you being treated for high blood pressure? 0  BMI more than 35 kg/m2? 1  Age > 50 (1-yes) 1  Neck circumference greater than:Male 16 inches or larger, Male 17inches or larger? 1  Male Gender (Yes=1) 1  Obstructive Sleep Apnea Score 6  Score 5 or greater  Results sent to PCP

## 2016-08-28 NOTE — Progress Notes (Signed)
Spoke with pt for pre-op call. Pt denies cardiac history, chest pain or sob. Pt's stopbang tool positive, no PCP listed to send results.

## 2016-08-29 ENCOUNTER — Ambulatory Visit (HOSPITAL_COMMUNITY): Payer: Worker's Compensation | Admitting: Anesthesiology

## 2016-08-29 ENCOUNTER — Ambulatory Visit (HOSPITAL_COMMUNITY)
Admission: RE | Admit: 2016-08-29 | Discharge: 2016-08-29 | Disposition: A | Discharge status: Home / Self Care | Payer: Worker's Compensation | Source: Ambulatory Visit | Attending: Orthopaedic Surgery | Admitting: Orthopaedic Surgery

## 2016-08-29 ENCOUNTER — Encounter (HOSPITAL_COMMUNITY): Payer: Self-pay | Admitting: *Deleted

## 2016-08-29 ENCOUNTER — Ambulatory Visit (HOSPITAL_COMMUNITY): Payer: Worker's Compensation

## 2016-08-29 ENCOUNTER — Encounter (HOSPITAL_COMMUNITY)
Admission: RE | Disposition: A | Discharge status: Home / Self Care | Payer: Self-pay | Source: Ambulatory Visit | Attending: Orthopaedic Surgery

## 2016-08-29 DIAGNOSIS — Z79899 Other long term (current) drug therapy: Secondary | ICD-10-CM | POA: Diagnosis not present

## 2016-08-29 DIAGNOSIS — S8262XA Displaced fracture of lateral malleolus of left fibula, initial encounter for closed fracture: Secondary | ICD-10-CM | POA: Diagnosis not present

## 2016-08-29 DIAGNOSIS — Z419 Encounter for procedure for purposes other than remedying health state, unspecified: Secondary | ICD-10-CM

## 2016-08-29 DIAGNOSIS — K219 Gastro-esophageal reflux disease without esophagitis: Secondary | ICD-10-CM | POA: Insufficient documentation

## 2016-08-29 DIAGNOSIS — X58XXXA Exposure to other specified factors, initial encounter: Secondary | ICD-10-CM | POA: Insufficient documentation

## 2016-08-29 DIAGNOSIS — Z791 Long term (current) use of non-steroidal anti-inflammatories (NSAID): Secondary | ICD-10-CM | POA: Insufficient documentation

## 2016-08-29 DIAGNOSIS — Z6841 Body Mass Index (BMI) 40.0 and over, adult: Secondary | ICD-10-CM | POA: Diagnosis not present

## 2016-08-29 HISTORY — PX: ORIF ANKLE FRACTURE: SHX5408

## 2016-08-29 HISTORY — DX: Anemia, unspecified: D64.9

## 2016-08-29 LAB — CBC
HCT: 40.3 % (ref 39.0–52.0)
HEMOGLOBIN: 12.8 g/dL — AB (ref 13.0–17.0)
MCH: 29.3 pg (ref 26.0–34.0)
MCHC: 31.8 g/dL (ref 30.0–36.0)
MCV: 92.2 fL (ref 78.0–100.0)
Platelets: 192 10*3/uL (ref 150–400)
RBC: 4.37 MIL/uL (ref 4.22–5.81)
RDW: 13.4 % (ref 11.5–15.5)
WBC: 6.6 10*3/uL (ref 4.0–10.5)

## 2016-08-29 SURGERY — OPEN REDUCTION INTERNAL FIXATION (ORIF) ANKLE FRACTURE
Anesthesia: General | Site: Ankle | Laterality: Left

## 2016-08-29 MED ORDER — LIDOCAINE HCL (CARDIAC) 20 MG/ML IV SOLN
INTRAVENOUS | Status: DC | PRN
  Administered 2016-08-29: 60 mg via INTRAVENOUS

## 2016-08-29 MED ORDER — SUCCINYLCHOLINE CHLORIDE 200 MG/10ML IV SOSY
PREFILLED_SYRINGE | INTRAVENOUS | Status: AC
  Filled 2016-08-29: qty 10

## 2016-08-29 MED ORDER — PROPOFOL 10 MG/ML IV BOLUS
INTRAVENOUS | Status: AC
  Filled 2016-08-29: qty 20

## 2016-08-29 MED ORDER — FENTANYL CITRATE (PF) 100 MCG/2ML IJ SOLN
INTRAMUSCULAR | Status: AC
  Administered 2016-08-29: 50 ug via INTRAVENOUS
  Filled 2016-08-29: qty 2

## 2016-08-29 MED ORDER — ASPIRIN EC 325 MG PO TBEC: 325.0000 mg | DELAYED_RELEASE_TABLET | Freq: Two times a day (BID) | ORAL | 0 refills | Status: DC

## 2016-08-29 MED ORDER — SENNOSIDES-DOCUSATE SODIUM 8.6-50 MG PO TABS: 1.0000 | ORAL_TABLET | Freq: Every evening | ORAL | 1 refills | Status: DC | PRN

## 2016-08-29 MED ORDER — PROMETHAZINE HCL 25 MG/ML IJ SOLN: 6.2500 mg | INTRAMUSCULAR | Status: DC | PRN

## 2016-08-29 MED ORDER — PROPOFOL 10 MG/ML IV BOLUS
INTRAVENOUS | Status: DC | PRN
  Administered 2016-08-29: 50 mg via INTRAVENOUS
  Administered 2016-08-29: 200 mg via INTRAVENOUS

## 2016-08-29 MED ORDER — LACTATED RINGERS IV SOLN
INTRAVENOUS | Status: DC | PRN
  Administered 2016-08-29 (×2): via INTRAVENOUS

## 2016-08-29 MED ORDER — ALBUTEROL SULFATE HFA 108 (90 BASE) MCG/ACT IN AERS
INHALATION_SPRAY | RESPIRATORY_TRACT | Status: DC | PRN
  Administered 2016-08-29: 4 via RESPIRATORY_TRACT

## 2016-08-29 MED ORDER — LIDOCAINE 2% (20 MG/ML) 5 ML SYRINGE
INTRAMUSCULAR | Status: AC
  Filled 2016-08-29: qty 5

## 2016-08-29 MED ORDER — FENTANYL CITRATE (PF) 100 MCG/2ML IJ SOLN
INTRAMUSCULAR | Status: AC
  Filled 2016-08-29: qty 2

## 2016-08-29 MED ORDER — MIDAZOLAM HCL 2 MG/2ML IJ SOLN
INTRAMUSCULAR | Status: AC
  Administered 2016-08-29: 2 mg via INTRAVENOUS
  Filled 2016-08-29: qty 2

## 2016-08-29 MED ORDER — METHOCARBAMOL 750 MG PO TABS: 750.0000 mg | ORAL_TABLET | Freq: Two times a day (BID) | ORAL | 0 refills | Status: DC | PRN

## 2016-08-29 MED ORDER — FENTANYL CITRATE (PF) 100 MCG/2ML IJ SOLN
INTRAMUSCULAR | Status: AC
  Filled 2016-08-29: qty 4

## 2016-08-29 MED ORDER — ONDANSETRON HCL 4 MG/2ML IJ SOLN
INTRAMUSCULAR | Status: DC | PRN
  Administered 2016-08-29: 4 mg via INTRAVENOUS

## 2016-08-29 MED ORDER — OXYCODONE HCL 5 MG PO TABS: 5.0000 mg | ORAL_TABLET | ORAL | 0 refills | Status: DC | PRN

## 2016-08-29 MED ORDER — OXYCODONE HCL ER 10 MG PO T12A: 10.0000 mg | EXTENDED_RELEASE_TABLET | Freq: Two times a day (BID) | ORAL | 0 refills | Status: DC

## 2016-08-29 MED ORDER — ONDANSETRON HCL 4 MG/2ML IJ SOLN
INTRAMUSCULAR | Status: AC
  Filled 2016-08-29: qty 2

## 2016-08-29 MED ORDER — 0.9 % SODIUM CHLORIDE (POUR BTL) OPTIME
TOPICAL | Status: DC | PRN
  Administered 2016-08-29: 1000 mL

## 2016-08-29 MED ORDER — ROCURONIUM BROMIDE 10 MG/ML (PF) SYRINGE
PREFILLED_SYRINGE | INTRAVENOUS | Status: AC
  Filled 2016-08-29: qty 10

## 2016-08-29 MED ORDER — SUCCINYLCHOLINE CHLORIDE 200 MG/10ML IV SOSY
PREFILLED_SYRINGE | INTRAVENOUS | Status: DC | PRN
  Administered 2016-08-29: 140 mg via INTRAVENOUS

## 2016-08-29 MED ORDER — BUPIVACAINE HCL (PF) 0.5 % IJ SOLN
INTRAMUSCULAR | Status: DC | PRN
  Administered 2016-08-29: 30 mL via PERINEURAL

## 2016-08-29 MED ORDER — MIDAZOLAM HCL 2 MG/2ML IJ SOLN
2.0000 mg | Freq: Once | INTRAMUSCULAR | Status: AC
  Administered 2016-08-29: 2 mg via INTRAVENOUS

## 2016-08-29 MED ORDER — FENTANYL CITRATE (PF) 100 MCG/2ML IJ SOLN
INTRAMUSCULAR | Status: DC | PRN
  Administered 2016-08-29: 50 ug via INTRAVENOUS
  Administered 2016-08-29: 100 ug via INTRAVENOUS

## 2016-08-29 MED ORDER — HYDROMORPHONE HCL 1 MG/ML IJ SOLN: 0.2500 mg | INTRAMUSCULAR | Status: DC | PRN

## 2016-08-29 MED ORDER — LACTATED RINGERS IV SOLN
Freq: Once | INTRAVENOUS | Status: AC
  Administered 2016-08-29: 10:00:00 via INTRAVENOUS

## 2016-08-29 MED ORDER — FENTANYL CITRATE (PF) 100 MCG/2ML IJ SOLN
50.0000 ug | Freq: Once | INTRAMUSCULAR | Status: AC
  Administered 2016-08-29: 50 ug via INTRAVENOUS

## 2016-08-29 MED ORDER — MIDAZOLAM HCL 2 MG/2ML IJ SOLN
INTRAMUSCULAR | Status: AC
  Filled 2016-08-29: qty 2

## 2016-08-29 MED ORDER — ONDANSETRON HCL 4 MG PO TABS: 4.0000 mg | ORAL_TABLET | Freq: Three times a day (TID) | ORAL | 0 refills | Status: DC | PRN

## 2016-08-29 MED ORDER — ALBUTEROL SULFATE HFA 108 (90 BASE) MCG/ACT IN AERS
INHALATION_SPRAY | RESPIRATORY_TRACT | Status: AC
  Filled 2016-08-29: qty 6.7

## 2016-08-29 MED ORDER — DEXTROSE 5 % IV SOLN
3.0000 g | INTRAVENOUS | Status: AC
  Administered 2016-08-29: 3 g via INTRAVENOUS
  Filled 2016-08-29: qty 3000

## 2016-08-29 SURGICAL SUPPLY — 76 items
BANDAGE ACE 4X5 VEL STRL LF (GAUZE/BANDAGES/DRESSINGS) IMPLANT
BANDAGE ACE 6X5 VEL STRL LF (GAUZE/BANDAGES/DRESSINGS) ×3 IMPLANT
BANDAGE ELASTIC 4 VELCRO ST LF (GAUZE/BANDAGES/DRESSINGS) ×3 IMPLANT
BANDAGE ESMARK 6X9 LF (GAUZE/BANDAGES/DRESSINGS) ×1 IMPLANT
BIT DRILL QC 2.7 6.3IN  SHORT (BIT) ×2
BIT DRILL QC 2.7 6.3IN SHORT (BIT) ×1 IMPLANT
BLADE SURG 15 STRL LF DISP TIS (BLADE) ×1 IMPLANT
BLADE SURG 15 STRL SS (BLADE) ×2
BNDG COHESIVE 4X5 TAN STRL (GAUZE/BANDAGES/DRESSINGS) ×3 IMPLANT
BNDG COHESIVE 6X5 TAN STRL LF (GAUZE/BANDAGES/DRESSINGS) IMPLANT
BNDG ESMARK 6X9 LF (GAUZE/BANDAGES/DRESSINGS) ×3
CANISTER SUCT 3000ML PPV (MISCELLANEOUS) ×3 IMPLANT
COVER SURGICAL LIGHT HANDLE (MISCELLANEOUS) ×3 IMPLANT
CUFF TOURNIQUET SINGLE 34IN LL (TOURNIQUET CUFF) ×3 IMPLANT
CUFF TOURNIQUET SINGLE 44IN (TOURNIQUET CUFF) IMPLANT
DRAPE C-ARM 42X72 X-RAY (DRAPES) ×3 IMPLANT
DRAPE C-ARMOR (DRAPES) ×3 IMPLANT
DRAPE IMP U-DRAPE 54X76 (DRAPES) ×3 IMPLANT
DRAPE INCISE IOBAN 66X45 STRL (DRAPES) IMPLANT
DRAPE U-SHAPE 47X51 STRL (DRAPES) ×3 IMPLANT
DURAPREP 26ML APPLICATOR (WOUND CARE) ×3 IMPLANT
ELECT CAUTERY BLADE 6.4 (BLADE) ×3 IMPLANT
ELECT REM PT RETURN 9FT ADLT (ELECTROSURGICAL) ×3
ELECTRODE REM PT RTRN 9FT ADLT (ELECTROSURGICAL) ×1 IMPLANT
FACESHIELD WRAPAROUND (MASK) IMPLANT
GAUZE SPONGE 4X4 12PLY STRL (GAUZE/BANDAGES/DRESSINGS) ×3 IMPLANT
GAUZE XEROFORM 5X9 LF (GAUZE/BANDAGES/DRESSINGS) ×3 IMPLANT
GLOVE BIOGEL PI IND STRL 7.0 (GLOVE) ×2 IMPLANT
GLOVE BIOGEL PI IND STRL 7.5 (GLOVE) ×1 IMPLANT
GLOVE BIOGEL PI IND STRL 8 (GLOVE) ×1 IMPLANT
GLOVE BIOGEL PI INDICATOR 7.0 (GLOVE) ×4
GLOVE BIOGEL PI INDICATOR 7.5 (GLOVE) ×2
GLOVE BIOGEL PI INDICATOR 8 (GLOVE) ×2
GLOVE SKINSENSE NS SZ7.5 (GLOVE) ×2
GLOVE SKINSENSE STRL SZ7.5 (GLOVE) ×1 IMPLANT
GLOVE SURG SS PI 6.5 STRL IVOR (GLOVE) ×3 IMPLANT
GLOVE SURG SYN 7.5  E (GLOVE) ×4
GLOVE SURG SYN 7.5 E (GLOVE) ×2 IMPLANT
GOWN STRL REIN XL XLG (GOWN DISPOSABLE) ×3 IMPLANT
KIT BASIN OR (CUSTOM PROCEDURE TRAY) ×3 IMPLANT
KIT ROOM TURNOVER OR (KITS) ×3 IMPLANT
NEEDLE HYPO 25GX1X1/2 BEV (NEEDLE) IMPLANT
NS IRRIG 1000ML POUR BTL (IV SOLUTION) ×3 IMPLANT
PACK ORTHO EXTREMITY (CUSTOM PROCEDURE TRAY) ×3 IMPLANT
PAD ABD 8X10 STRL (GAUZE/BANDAGES/DRESSINGS) ×6 IMPLANT
PAD ARMBOARD 7.5X6 YLW CONV (MISCELLANEOUS) ×6 IMPLANT
PAD CAST 3X4 CTTN HI CHSV (CAST SUPPLIES) IMPLANT
PAD CAST 4YDX4 CTTN HI CHSV (CAST SUPPLIES) ×1 IMPLANT
PADDING CAST COTTON 3X4 STRL (CAST SUPPLIES)
PADDING CAST COTTON 4X4 STRL (CAST SUPPLIES) ×2
PADDING CAST COTTON 6X4 STRL (CAST SUPPLIES) ×3 IMPLANT
PLATE TUBULAR 1/3 6 HOLE (Plate) ×3 IMPLANT
SCREW 5.0X18 (Screw) ×3 IMPLANT
SCREW CANCELLOUS 5.0X16MM (Screw) ×3 IMPLANT
SCREW CORTEX 2.7 14MM (Screw) ×3 IMPLANT
SCREW NL 3.5X14 (Screw) ×3 IMPLANT
SCREW NL 3.5X22 (Screw) ×3 IMPLANT
SCREW NON LOCK 3.5X12 (Screw) ×3 IMPLANT
SCREW NONLOCK 3.5X10 (Screw) ×3 IMPLANT
SPONGE GAUZE 4X4 12PLY STER LF (GAUZE/BANDAGES/DRESSINGS) ×3 IMPLANT
SPONGE LAP 18X18 X RAY DECT (DISPOSABLE) IMPLANT
SUCTION FRAZIER HANDLE 10FR (MISCELLANEOUS) ×2
SUCTION TUBE FRAZIER 10FR DISP (MISCELLANEOUS) ×1 IMPLANT
SUT ETHILON 3 0 PS 1 (SUTURE) ×6 IMPLANT
SUT MNCRL AB 4-0 PS2 18 (SUTURE) ×3 IMPLANT
SUT VIC AB 1 CT1 27 (SUTURE) ×6
SUT VIC AB 1 CT1 27XBRD ANBCTR (SUTURE) ×3 IMPLANT
SUT VIC AB 2-0 CT1 27 (SUTURE) ×2
SUT VIC AB 2-0 CT1 TAPERPNT 27 (SUTURE) ×1 IMPLANT
SUT VIC AB 3-0 SH 8-18 (SUTURE) ×3 IMPLANT
SYR CONTROL 10ML LL (SYRINGE) IMPLANT
TOWEL OR 17X24 6PK STRL BLUE (TOWEL DISPOSABLE) ×3 IMPLANT
TOWEL OR 17X26 10 PK STRL BLUE (TOWEL DISPOSABLE) ×6 IMPLANT
TUBE CONNECTING 12'X1/4 (SUCTIONS) ×1
TUBE CONNECTING 12X1/4 (SUCTIONS) ×2 IMPLANT
WATER STERILE IRR 1000ML POUR (IV SOLUTION) ×3 IMPLANT

## 2016-08-29 NOTE — Discharge Instructions (Signed)
° ° °  1. Keep dressing clean and dry, may remove surgical dressings in 3 days and then place dry dressing and change as needed. 2. Elevate foot above level of the heart 3. Take aspirin to prevent blood clots 4. Take pain meds as needed 5. Strict non weight bearing to operative extremity

## 2016-08-29 NOTE — Anesthesia Preprocedure Evaluation (Signed)
Anesthesia Evaluation  Patient identified by MRN, date of birth, ID band Patient awake    Reviewed: Allergy & Precautions, NPO status , Patient's Chart, lab work & pertinent test results  Airway Mallampati: II  TM Distance: >3 FB Neck ROM: Full    Dental no notable dental hx.    Pulmonary neg pulmonary ROS,    Pulmonary exam normal breath sounds clear to auscultation       Cardiovascular negative cardio ROS Normal cardiovascular exam Rhythm:Regular Rate:Normal     Neuro/Psych negative neurological ROS  negative psych ROS   GI/Hepatic Neg liver ROS, GERD  ,  Endo/Other  Morbid obesity  Renal/GU negative Renal ROS  negative genitourinary   Musculoskeletal negative musculoskeletal ROS (+)   Abdominal   Peds negative pediatric ROS (+)  Hematology negative hematology ROS (+)   Anesthesia Other Findings   Reproductive/Obstetrics negative OB ROS                             Anesthesia Physical Anesthesia Plan  ASA: III  Anesthesia Plan: General   Post-op Pain Management: GA combined w/ Regional for post-op pain   Induction: Intravenous  Airway Management Planned: Oral ETT  Additional Equipment:   Intra-op Plan:   Post-operative Plan: Extubation in OR  Informed Consent: I have reviewed the patients History and Physical, chart, labs and discussed the procedure including the risks, benefits and alternatives for the proposed anesthesia with the patient or authorized representative who has indicated his/her understanding and acceptance.   Dental advisory given  Plan Discussed with: CRNA and Surgeon  Anesthesia Plan Comments:         Anesthesia Quick Evaluation

## 2016-08-29 NOTE — Anesthesia Procedure Notes (Signed)
Anesthesia Regional Block:  Popliteal block  Pre-Anesthetic Checklist: ,, timeout performed, Correct Patient, Correct Site, Correct Laterality, Correct Procedure, Correct Position, site marked, Risks and benefits discussed,  Surgical consent,  Pre-op evaluation,  At surgeon's request and post-op pain management  Laterality: Left  Prep: chloraprep       Needles:  Injection technique: Single-shot  Needle Type: Echogenic Needle     Needle Length: 9cm 9 cm Needle Gauge: 21 G    Additional Needles:  Procedures: ultrasound guided (picture in chart) Popliteal block Narrative:  Injection made incrementally with aspirations every 5 mL.  Performed by: Personally  Anesthesiologist: Tamre Cass  Additional Notes: Patient tolerated the procedure well without complications

## 2016-08-29 NOTE — Anesthesia Procedure Notes (Signed)
Procedure Name: Intubation Date/Time: 08/29/2016 12:59 PM Performed by: Candis Shine Pre-anesthesia Checklist: Patient identified, Emergency Drugs available, Suction available and Patient being monitored Patient Re-evaluated:Patient Re-evaluated prior to inductionOxygen Delivery Method: Circle System Utilized Preoxygenation: Pre-oxygenation with 100% oxygen Intubation Type: IV induction Laryngoscope Size: Glidescope and 4 Grade View: Grade I Tube type: Oral Tube size: 7.5 mm Number of attempts: 1 Airway Equipment and Method: Stylet and Oral airway Placement Confirmation: ETT inserted through vocal cords under direct vision,  positive ETCO2 and breath sounds checked- equal and bilateral Secured at: 23 cm Tube secured with: Tape Dental Injury: Teeth and Oropharynx as per pre-operative assessment  Difficulty Due To: Difficulty was anticipated Future Recommendations: Recommend- induction with short-acting agent, and alternative techniques readily available
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
## 2016-08-29 NOTE — Op Note (Signed)
   Date of Surgery: 08/29/2016  INDICATIONS: Mr. Bonnet is a 55 y.o.-year-old male who sustained a left ankle fracture; he was indicated for open reduction and internal fixation due to the displaced nature of the articular fracture and came to the operating room today for this procedure. The patient did consent to the procedure after discussion of the risks and benefits.  PREOPERATIVE DIAGNOSIS: left lateral malleolus ankle fracture  POSTOPERATIVE DIAGNOSIS: Same.  PROCEDURE: Open treatment of left ankle fracture with internal fixation. Lateral malleolar CPT [phone]  SURGEON: N. Eduard Roux, M.D.  ASSIST: April Green, RNFA.  ANESTHESIA:  general, regional  TOURNIQUET TIME: less than 60 mins  IV FLUIDS AND URINE: See anesthesia.  ESTIMATED BLOOD LOSS: minimal mL.  IMPLANTS: [employer] 6 hole 1/3 tubular plate  COMPLICATIONS: None.  DESCRIPTION OF PROCEDURE: The patient was brought to the operating room and placed supine on the operating table.  The patient had been signed prior to the procedure and this was documented. The patient had the anesthesia placed by the anesthesiologist.  A nonsterile tourniquet was placed on the upper thigh.  The prep verification and incision time-outs were performed to confirm that this was the correct patient, site, side and location. The patient had an SCD on the opposite lower extremity. The patient did receive antibiotics prior to the incision and was re-dosed during the procedure as needed at indicated intervals.  The patient had the lower extremity prepped and draped in the standard surgical fashion.  The extremity was exsanguinated using an esmarch bandage and the tourniquet was inflated to 300 mm Hg.  A lateral incision was created over the distal fibula. Full-thickness flaps were elevated. Dissection was carried down to the fibula. Subperiosteal elevation was performed. The fracture was exposed. Organized hematoma was removed. The fracture was then  reduced and clamped. An anterior to posterior lag screw was then placed. This was confirmed under fluoroscopy. The clamp was removed and the fracture remained reduced. We then placed a 6-hole semitubular plate on the lateral aspect of the fibula at the appropriate position. Nonlocking screws were placed both distally and proximally from the fracture. Each screw had excellent purchase.  The wound was then thoroughly irrigated and closed in layer fashion using 0 Vicryl, 2-0 Vicryl, 3-0 nylon. Sterile dressings were applied. Patient tolerated procedure well had no immediate complications.  POSTOPERATIVE PLAN: Mr. Getz will remain nonweightbearing on this leg for approximately 6 weeks; Mr. Mcelhannon will return for suture removal in 2 weeks.  He will be immobilized in a short leg splint and then transitioned to a CAM walker at his first follow up appointment.  Mr. Vankampen will receive DVT prophylaxis based on other medications, activity level, and risk ratio of bleeding to thrombosis.  Azucena Cecil, MD [facility] [phone] 1:55 PM

## 2016-08-29 NOTE — Progress Notes (Signed)
Orthopedic Tech Progress Note Patient Details:  Jeffery Phelps [birth year] [id number]  Patient ID: Jeffery Phelps, male   DOB: 05-Aug-1961, 55 y.o.   MRN: [id number]   Jeffery Phelps 08/29/2016, 2:01 PM As ordered by Dr. Erlinda Hong

## 2016-08-29 NOTE — Transfer of Care (Signed)
Immediate Anesthesia Transfer of Care Note  Patient: Jeffery Phelps  Procedure(s) Performed: Procedure(s): OPEN REDUCTION INTERNAL FIXATION (ORIF) LEFT ANKLE FRACTURE (Left)  Patient Location: PACU  Anesthesia Type:General  Level of Consciousness: awake, alert  and oriented  Airway & Oxygen Therapy: Patient Spontanous Breathing and Patient connected to face mask oxygen  Post-op Assessment: Report given to RN and Post -op Vital signs reviewed and stable  Post vital signs: Reviewed and stable  Last Vitals:  Vitals:   08/29/16 1205 08/29/16 1210  BP: (!) 144/70 140/71  Pulse: 76 73  Resp: 14 18  Temp:      Last Pain:  Vitals:   08/29/16 1205  TempSrc:   PainSc: 0-No pain      Patients Stated Pain Goal: 2 ([id number])  Complications: No apparent anesthesia complications

## 2016-08-29 NOTE — Progress Notes (Signed)
Orthopedic Tech Progress Note Patient Details:  Jeffery Phelps 10-09-1961 [id number]  Ortho Devices Type of Ortho Device: CAM walker Ortho Device/Splint Interventions: Loanne Drilling, Mateen Franssen 08/29/2016, 2:01 PM

## 2016-08-30 ENCOUNTER — Telehealth (INDEPENDENT_AMBULATORY_CARE_PROVIDER_SITE_OTHER): Payer: Self-pay | Admitting: Orthopaedic Surgery

## 2016-08-30 NOTE — Anesthesia Postprocedure Evaluation (Signed)
Anesthesia Post Note  Patient: Jeffery Phelps  Procedure(s) Performed: Procedure(s) (LRB): OPEN REDUCTION INTERNAL FIXATION (ORIF) LEFT ANKLE FRACTURE (Left)  Patient location during evaluation: PACU Anesthesia Type: General Level of consciousness: awake and alert and patient cooperative Pain management: pain level controlled Vital Signs Assessment: post-procedure vital signs reviewed and stable Respiratory status: spontaneous breathing and respiratory function stable Cardiovascular status: stable Anesthetic complications: no    Last Vitals:  Vitals:   08/29/16 1450 08/29/16 1501  BP: 128/65 131/66  Pulse: 74 70  Resp: 14 16  Temp: 36.5 C     Last Pain:  Vitals:   08/29/16 1501  TempSrc:   PainSc: [place]

## 2016-08-30 NOTE — Telephone Encounter (Signed)
Patient is requesting a knee scooter, crutches and    Contact Info: [phone]

## 2016-09-02 NOTE — Telephone Encounter (Signed)
Yes approve

## 2016-09-02 NOTE — Telephone Encounter (Signed)
Pt. Wife called back about these above. Also a Urinal. Need to be written as prescription and faxed to [phone] claim # [id number] case manager is April Lassiter. Pt is 6'1 for crutches.

## 2016-09-02 NOTE — Telephone Encounter (Signed)
Please advise 

## 2016-09-03 ENCOUNTER — Encounter (HOSPITAL_COMMUNITY): Payer: Self-pay | Admitting: Orthopaedic Surgery

## 2016-09-03 NOTE — Telephone Encounter (Signed)
FAXED ORDER TO [phone]

## 2016-09-05 ENCOUNTER — Telehealth (INDEPENDENT_AMBULATORY_CARE_PROVIDER_SITE_OTHER): Payer: Self-pay | Admitting: *Deleted

## 2016-09-05 NOTE — Telephone Encounter (Signed)
Faxed note to [phone] attn.  Claim number [id number]

## 2016-09-05 NOTE — Telephone Encounter (Signed)
Heather from [employer] for OP notes for [facility] claim. Call back number is [phone] ext [phone]. Fax is  [phone] attn.  Claim number [id number]

## 2016-09-13 ENCOUNTER — Ambulatory Visit (INDEPENDENT_AMBULATORY_CARE_PROVIDER_SITE_OTHER): Payer: Worker's Compensation | Admitting: Orthopaedic Surgery

## 2016-09-13 ENCOUNTER — Encounter (INDEPENDENT_AMBULATORY_CARE_PROVIDER_SITE_OTHER): Payer: Self-pay | Admitting: Orthopaedic Surgery

## 2016-09-13 ENCOUNTER — Ambulatory Visit (INDEPENDENT_AMBULATORY_CARE_PROVIDER_SITE_OTHER): Payer: Worker's Compensation

## 2016-09-13 DIAGNOSIS — S8262XD Displaced fracture of lateral malleolus of left fibula, subsequent encounter for closed fracture with routine healing: Secondary | ICD-10-CM | POA: Diagnosis not present

## 2016-09-13 DIAGNOSIS — S8262XA Displaced fracture of lateral malleolus of left fibula, initial encounter for closed fracture: Secondary | ICD-10-CM
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
## 2016-09-15 ENCOUNTER — Encounter (HOSPITAL_COMMUNITY): Payer: Self-pay | Admitting: Orthopaedic Surgery

## 2016-09-23 ENCOUNTER — Telehealth (INDEPENDENT_AMBULATORY_CARE_PROVIDER_SITE_OTHER): Payer: Self-pay | Admitting: Orthopaedic Surgery

## 2016-09-23 NOTE — Telephone Encounter (Signed)
Patient brought in form on 09/11/16 to be completed [employer] form) and would like to know the status

## 2016-09-25 NOTE — Telephone Encounter (Signed)
Forms were faxed with all notes on 09/18/16. Called pt no answer left message on machine.

## 2016-09-30 ENCOUNTER — Ambulatory Visit (INDEPENDENT_AMBULATORY_CARE_PROVIDER_SITE_OTHER): Payer: Worker's Compensation

## 2016-09-30 ENCOUNTER — Encounter (INDEPENDENT_AMBULATORY_CARE_PROVIDER_SITE_OTHER): Payer: Self-pay | Admitting: Orthopaedic Surgery

## 2016-09-30 ENCOUNTER — Ambulatory Visit (INDEPENDENT_AMBULATORY_CARE_PROVIDER_SITE_OTHER): Payer: Worker's Compensation | Admitting: Orthopaedic Surgery

## 2016-09-30 DIAGNOSIS — S8262XA Displaced fracture of lateral malleolus of left fibula, initial encounter for closed fracture: Secondary | ICD-10-CM | POA: Diagnosis not present

## 2016-09-30 NOTE — Progress Notes (Signed)
Patient is 4 weeks status post ORIF left lateral malleolus fracture. He is doing well.  Physical exam of the left ankle shows well-healed surgical scar. No signs of infection. Minimal swelling. X  Three-view x-rays of the left ankle show stable fixation of the lateral malleolus.  We will begin weight-bear as tolerated in a cam walker. Begin physical therapy. 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 I did review the job requirements I think he'll be able to do this. Wean crutches and Cam Walker over the next 6 weeks. Follow-up in 6 weeks with repeat 3 view x-rays of the left ankle.
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
## 2016-10-09 ENCOUNTER — Telehealth (INDEPENDENT_AMBULATORY_CARE_PROVIDER_SITE_OTHER): Payer: Self-pay | Admitting: *Deleted

## 2016-10-09 NOTE — Telephone Encounter (Signed)
[name] [employer] called for work status on pt fax: [phone]

## 2016-10-09 NOTE — Telephone Encounter (Signed)
Faxed note to [phone]

## 2016-10-16 ENCOUNTER — Telehealth (INDEPENDENT_AMBULATORY_CARE_PROVIDER_SITE_OTHER): Payer: Self-pay | Admitting: Orthopaedic Surgery

## 2016-10-16 NOTE — Telephone Encounter (Signed)
°  [facility] calling to get script for [facility] patient.  He is scheduled there tomorrow.  Please fax ASAP  [phone]

## 2016-10-18 NOTE — Telephone Encounter (Signed)
Faxed order

## 2016-10-29 ENCOUNTER — Encounter (INDEPENDENT_AMBULATORY_CARE_PROVIDER_SITE_OTHER): Payer: Self-pay

## 2016-10-29 ENCOUNTER — Ambulatory Visit (INDEPENDENT_AMBULATORY_CARE_PROVIDER_SITE_OTHER): Payer: Self-pay

## 2016-10-29 ENCOUNTER — Ambulatory Visit (INDEPENDENT_AMBULATORY_CARE_PROVIDER_SITE_OTHER): Payer: Worker's Compensation | Admitting: Orthopaedic Surgery

## 2016-10-29 ENCOUNTER — Ambulatory Visit (INDEPENDENT_AMBULATORY_CARE_PROVIDER_SITE_OTHER): Payer: Worker's Compensation

## 2016-10-29 DIAGNOSIS — M25561 Pain in right knee: Secondary | ICD-10-CM | POA: Diagnosis not present

## 2016-10-29 MED ORDER — NAPROXEN 500 MG PO TABS: 500.0000 mg | ORAL_TABLET | Freq: Two times a day (BID) | ORAL | 3 refills | Status: DC
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
## 2016-11-08 ENCOUNTER — Telehealth (INDEPENDENT_AMBULATORY_CARE_PROVIDER_SITE_OTHER): Payer: Self-pay

## 2016-11-08 NOTE — Telephone Encounter (Signed)
Per Lisa(Insurance)....see message below.  " Hi! This patient was seen on 1/2 for his knee, he had been seeing Dr. Erlinda Hong for his ankle for workers comp. Is this knee related to the [facility] injury or should the 10/29/16 dos be filed to his personal insurance? Thank you! "

## 2016-11-08 NOTE — Telephone Encounter (Signed)
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

## 2016-11-14 ENCOUNTER — Ambulatory Visit (INDEPENDENT_AMBULATORY_CARE_PROVIDER_SITE_OTHER): Payer: Self-pay | Admitting: Orthopaedic Surgery

## 2016-11-19 ENCOUNTER — Ambulatory Visit (INDEPENDENT_AMBULATORY_CARE_PROVIDER_SITE_OTHER): Payer: Worker's Compensation | Admitting: Orthopaedic Surgery

## 2016-11-19 ENCOUNTER — Encounter (INDEPENDENT_AMBULATORY_CARE_PROVIDER_SITE_OTHER): Payer: Self-pay | Admitting: Orthopaedic Surgery

## 2016-11-19 ENCOUNTER — Ambulatory Visit (INDEPENDENT_AMBULATORY_CARE_PROVIDER_SITE_OTHER): Payer: Worker's Compensation

## 2016-11-19 VITALS — Ht 72.0 in | Wt 330.0 lb

## 2016-11-19 DIAGNOSIS — S8262XA Displaced fracture of lateral malleolus of left fibula, initial encounter for closed fracture: Secondary | ICD-10-CM

## 2016-11-19 NOTE — Progress Notes (Signed)
Jeffery Phelps is 2-1/2 months status post ORIF lateral malleolus fracture. He is doing well. He had good relief to the right knee injection. He has some numbness on the dorsum of his foot. Surgical scar is well-healed. X-ray show healed fracture. At this point he may discontinue physical therapy and just do home exercises. I think he has reached MMI with 15% partial impairment. Follow-up with me as needed.

## 2016-11-26 ENCOUNTER — Encounter (INDEPENDENT_AMBULATORY_CARE_PROVIDER_SITE_OTHER): Payer: Self-pay | Admitting: Orthopaedic Surgery

## 2016-11-26 ENCOUNTER — Ambulatory Visit (INDEPENDENT_AMBULATORY_CARE_PROVIDER_SITE_OTHER): Payer: Worker's Compensation | Admitting: Orthopaedic Surgery

## 2016-11-26 DIAGNOSIS — M25561 Pain in right knee: Secondary | ICD-10-CM | POA: Diagnosis not present
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
## 2016-12-02 ENCOUNTER — Ambulatory Visit
Admission: RE | Admit: 2016-12-02 | Discharge: 2016-12-02 | Disposition: A | Discharge status: Home / Self Care | Payer: Worker's Compensation | Source: Ambulatory Visit | Attending: Orthopaedic Surgery | Admitting: Orthopaedic Surgery

## 2016-12-02 DIAGNOSIS — M25561 Pain in right knee: Secondary | ICD-10-CM

## 2016-12-05 ENCOUNTER — Encounter (INDEPENDENT_AMBULATORY_CARE_PROVIDER_SITE_OTHER): Payer: Self-pay | Admitting: Orthopaedic Surgery

## 2016-12-05 ENCOUNTER — Encounter (INDEPENDENT_AMBULATORY_CARE_PROVIDER_SITE_OTHER): Payer: Self-pay

## 2016-12-05 ENCOUNTER — Ambulatory Visit (INDEPENDENT_AMBULATORY_CARE_PROVIDER_SITE_OTHER): Payer: Worker's Compensation | Admitting: Orthopaedic Surgery

## 2016-12-05 DIAGNOSIS — M25561 Pain in right knee: Secondary | ICD-10-CM | POA: Diagnosis not present
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
## 2017-01-02 ENCOUNTER — Encounter (INDEPENDENT_AMBULATORY_CARE_PROVIDER_SITE_OTHER): Payer: Self-pay | Admitting: Orthopaedic Surgery

## 2017-01-02 ENCOUNTER — Ambulatory Visit (INDEPENDENT_AMBULATORY_CARE_PROVIDER_SITE_OTHER): Payer: Self-pay | Admitting: Orthopaedic Surgery

## 2017-01-02 DIAGNOSIS — S8262XA Displaced fracture of lateral malleolus of left fibula, initial encounter for closed fracture: Secondary | ICD-10-CM | POA: Diagnosis not present
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
## 2017-01-07 ENCOUNTER — Telehealth (INDEPENDENT_AMBULATORY_CARE_PROVIDER_SITE_OTHER): Payer: Self-pay

## 2017-01-07 NOTE — Telephone Encounter (Signed)
Received fax requesting 11/19/16 office note. Faxed back to her.

## 2017-01-08 NOTE — Telephone Encounter (Signed)
April left another vm stating that yesterdays fax did not come through clearly. Refaxed the 11/19/16 note to [phone].

## 2017-09-30 IMAGING — MR MR KNEE*R* W/O CM
4 of 5 series · 16 of 40 positions shown · non-contrast
Comparison: None.

CLINICAL DATA: Medial right knee pain for 1 month.

EXAM:
MRI OF THE RIGHT KNEE WITHOUT CONTRAST
TECHNIQUE: Multiplanar, multisequence MR imaging of the knee was performed. No
intravenous contrast was administered.

[Series 4: PD · axial · 4.0mm · 0.25mm/px · z∈[-53,+42]mm · 7 of 24 slices shown (1 of 2)]
[im 1/24]
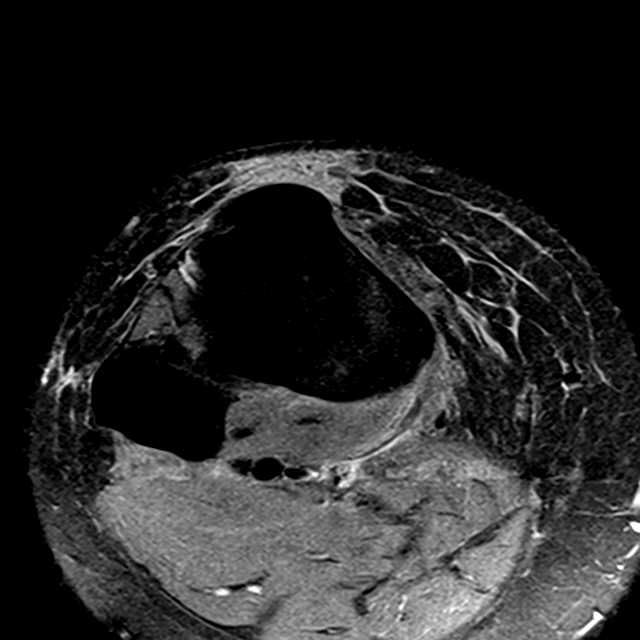
[im 3/24]
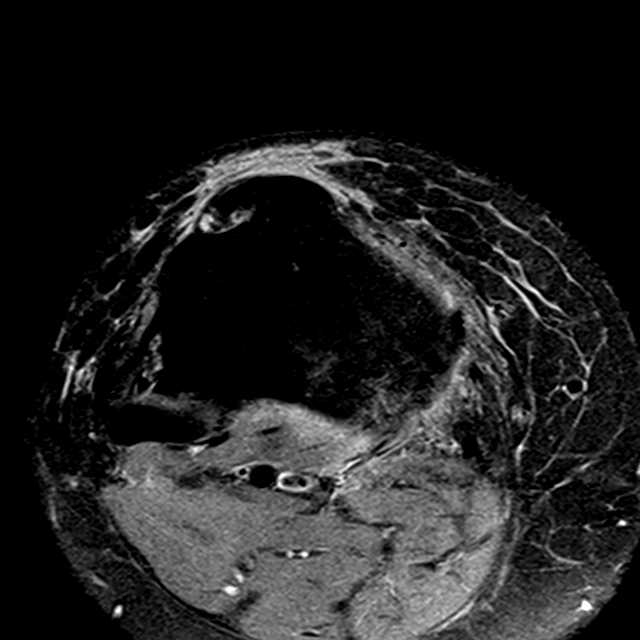
[im 8/24]
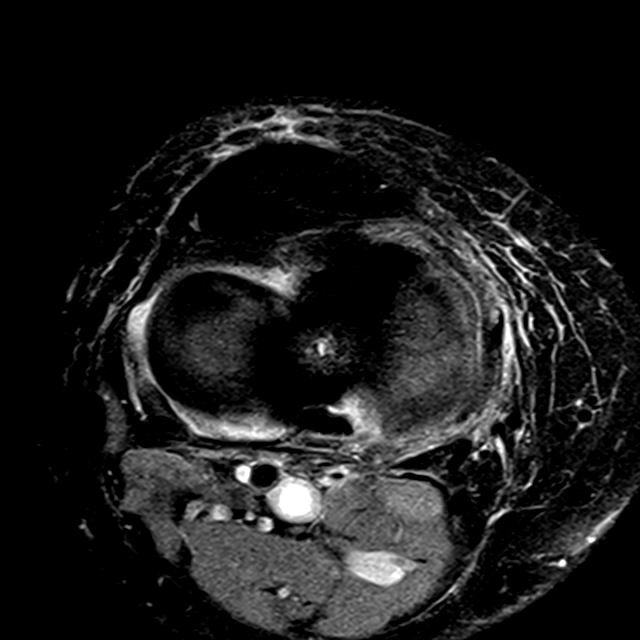
[im 11/24]
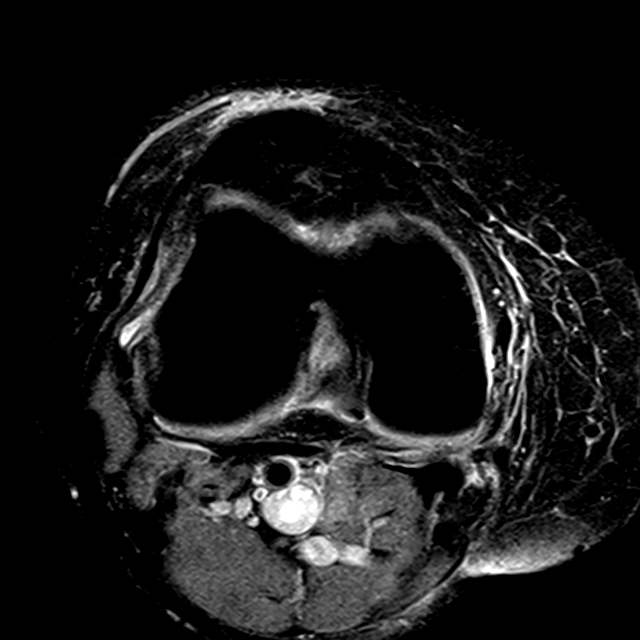
[im 13/24]
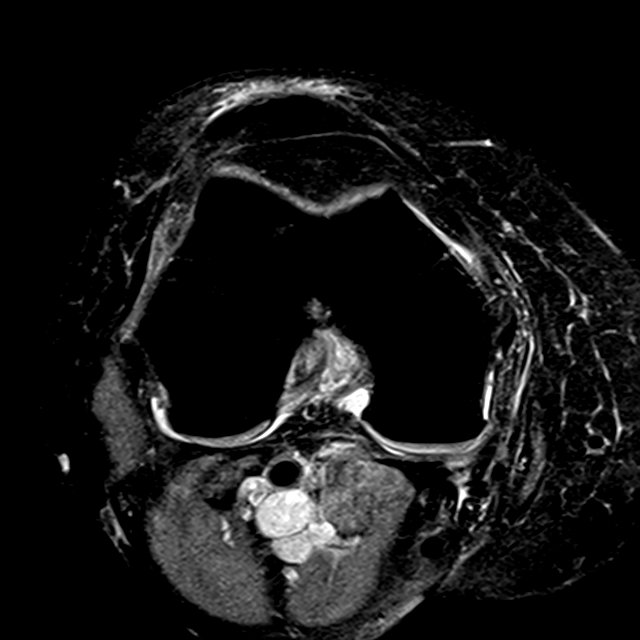
[im 16/24]
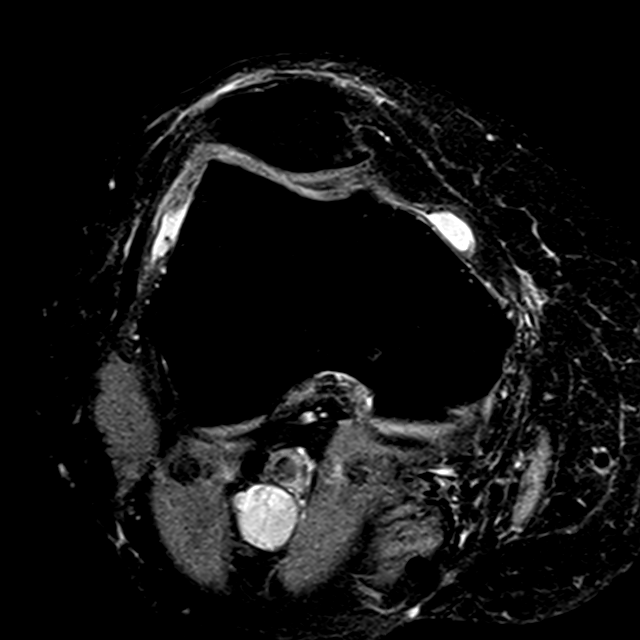
[im 21/24]
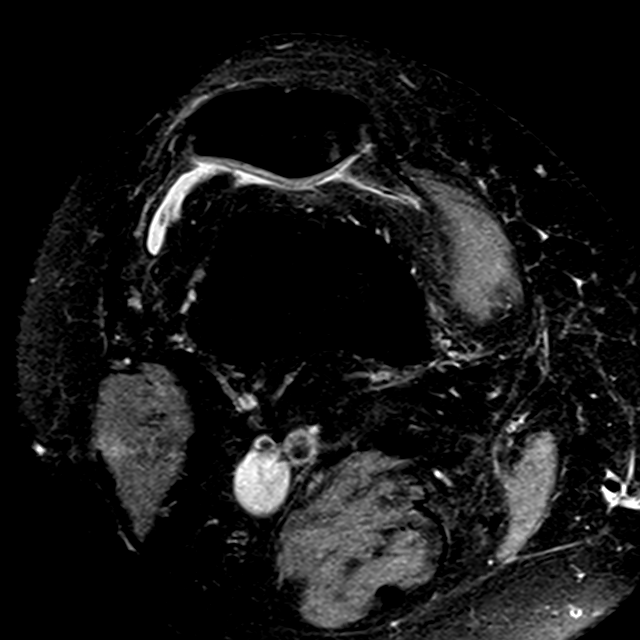

[Series 5: PD · coronal · 4.0mm · 0.31mm/px · 3 of 20 slices shown (2 of 2)]
[im 4/20]
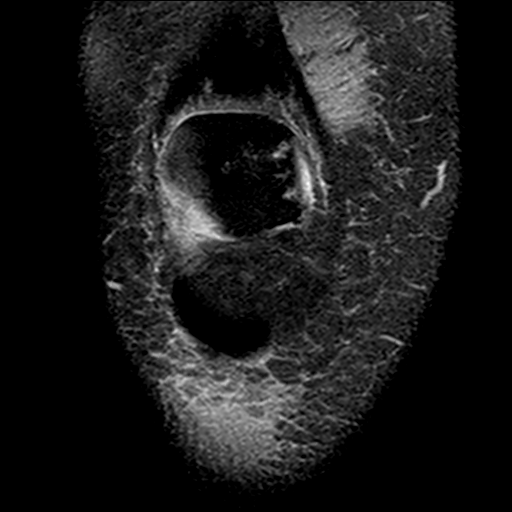
[im 10/20]
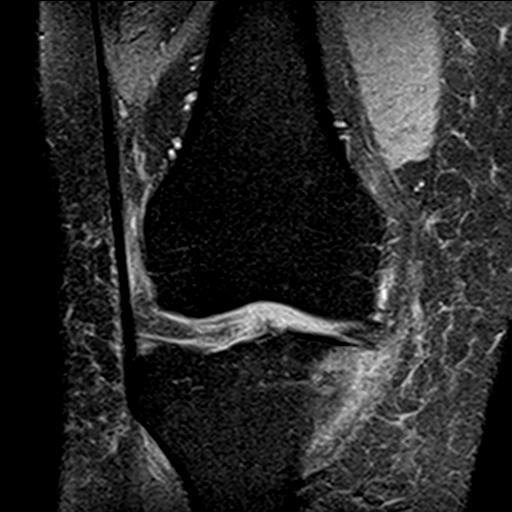
[im 16/20]
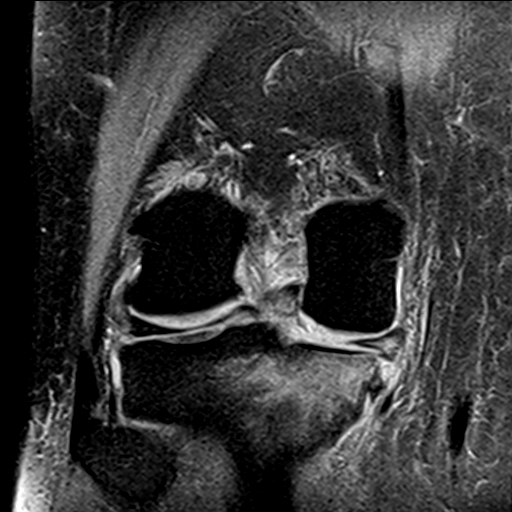

[Series 6: T2 · coronal · 4.0mm · 0.31mm/px · 3 of 20 slices shown]
[im 4/20]
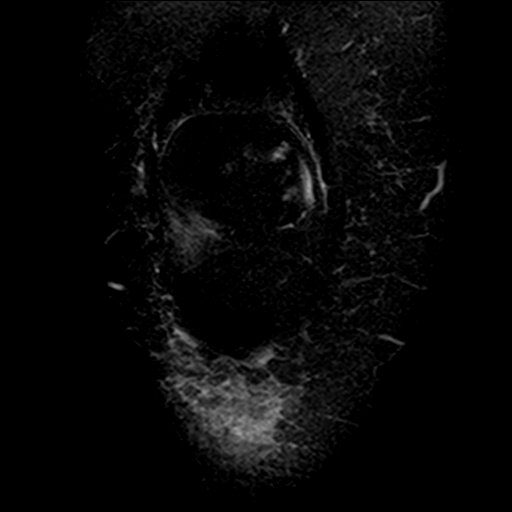
[im 10/20]
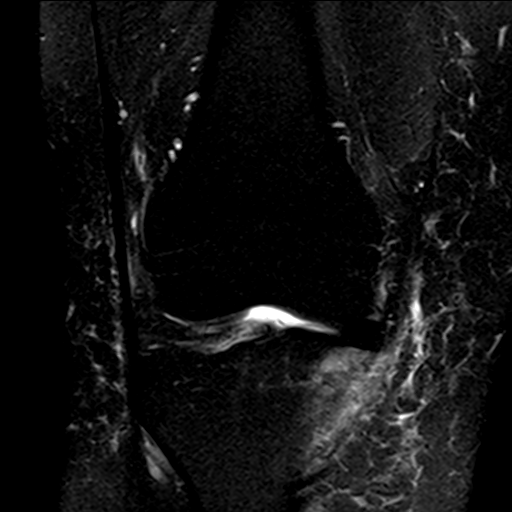
[im 16/20]
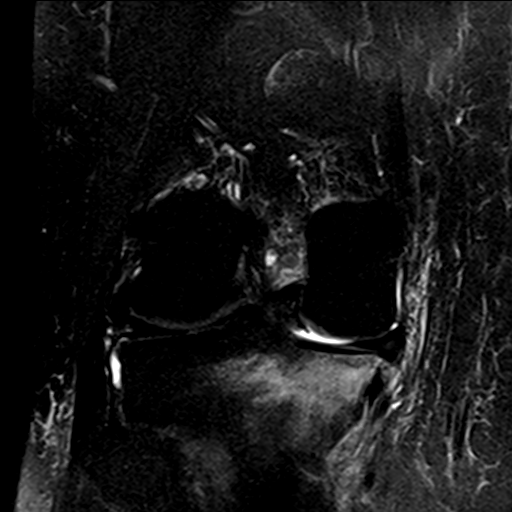

[Series 7: PD fat-sat · sagittal · 4.0mm · 0.31mm/px · 3 of 24 slices shown]
[im 3/24]
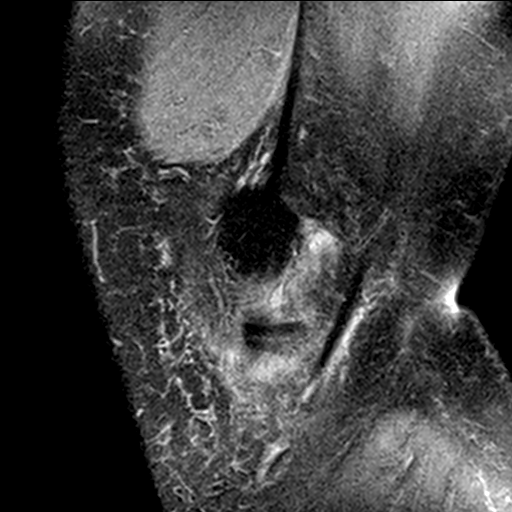
[im 12/24]
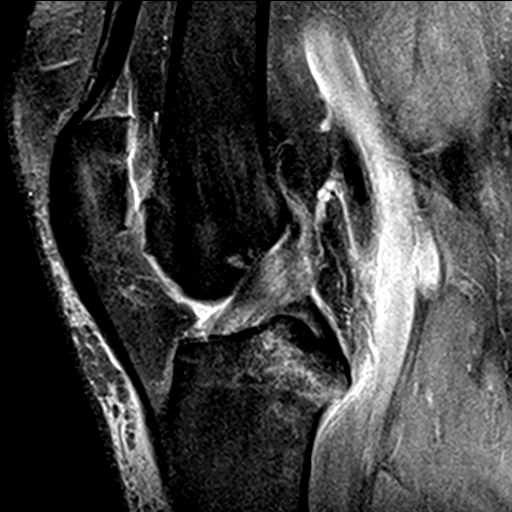
[im 21/24]
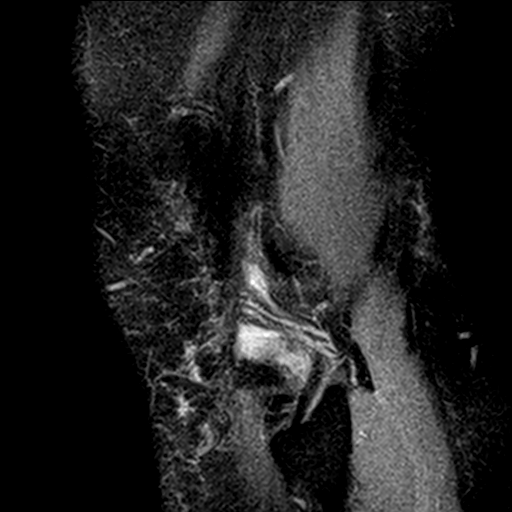

[16 of 40 positions shown; findings below may reference images not displayed]

FINDINGS: MENISCI

Medial meniscus: There is a complete radial tear of the root of the
posterior horn of the medial meniscus with secondary meniscal
subluxation.

Lateral meniscus:  Normal.

LIGAMENTS

Cruciates:  Intact.

Collaterals: Medial collateral ligament is intact. Lateral
collateral ligament complex is intact.

CARTILAGE

Patellofemoral: Multifocal grade 4 chondromalacia of the patella and
trochlear groove. Diffuse thinning of the articular cartilage in the
patellofemoral compartment.

Medial:  Diffuse slight thinning of the articular cartilage.

Lateral:  Normal.

Joint:  Small joint effusion.

Popliteal Fossa:  Normal.

Extensor Mechanism:  Normal.

Bones: Insufficiency fracture of subcortical region of medial tibial
plateau with prominent edema in the tibial plateau. This is felt to
be secondary to the meniscal tear.

Other: None
IMPRESSION: Complete radial tear of the root of the posterior horn of the medial
meniscus with secondary insufficiency fracture of the medial tibial
plateau.

Chondromalacia of the patellofemoral compartment.

## 2019-03-12 ENCOUNTER — Encounter: Payer: Self-pay | Admitting: Internal Medicine

## 2019-04-02 ENCOUNTER — Other Ambulatory Visit: Payer: Self-pay

## 2019-04-02 ENCOUNTER — Ambulatory Visit: Payer: Self-pay | Admitting: *Deleted

## 2019-04-02 VITALS — Ht 72.0 in | Wt 338.0 lb

## 2019-04-02 DIAGNOSIS — Z800 Family history of malignant neoplasm of digestive organs: Secondary | ICD-10-CM

## 2019-04-02 NOTE — Progress Notes (Signed)

## 2019-04-15 ENCOUNTER — Telehealth: Payer: Self-pay | Admitting: Internal Medicine

## 2019-04-15 NOTE — Telephone Encounter (Signed)

## 2019-04-16 ENCOUNTER — Encounter: Payer: Self-pay | Admitting: Internal Medicine

## 2019-04-16 ENCOUNTER — Ambulatory Visit (AMBULATORY_SURGERY_CENTER): Payer: BC Managed Care – PPO | Admitting: Internal Medicine

## 2019-04-16 ENCOUNTER — Other Ambulatory Visit: Payer: Self-pay

## 2019-04-16 VITALS — BP 149/81 | HR 60 | Temp 97.7°F | Resp 16 | Ht 72.0 in | Wt 338.0 lb

## 2019-04-16 DIAGNOSIS — Z789 Other specified health status: Secondary | ICD-10-CM | POA: Diagnosis not present

## 2019-04-16 DIAGNOSIS — Z800 Family history of malignant neoplasm of digestive organs: Secondary | ICD-10-CM | POA: Diagnosis present

## 2019-04-16 MED ORDER — SODIUM CHLORIDE 0.9 % IV SOLN: 500.0000 mL | Freq: Once | INTRAVENOUS | Status: DC

## 2019-04-16 NOTE — Progress Notes (Signed)
Report given to PACU, vss 

## 2019-04-16 NOTE — Progress Notes (Signed)
[place]

## 2019-04-16 NOTE — Progress Notes (Signed)
Pt's states no medical or surgical changes since previsit or office visit. 

## 2019-04-16 NOTE — Patient Instructions (Addendum)
The colonoscopy was normal again!  Your next routine colonoscopy should be in 5 years - 2025.  I am marking your chart as someone that it is hard to start IV's in. Sorry for that problem. This may help [name] avoid so many IV sticks in future.  I appreciate the opportunity to care for you. Gatha Mayer, MD, FACG   YOU HAD AN ENDOSCOPIC PROCEDURE TODAY AT [facility]:   Refer to the procedure report that was given to you for any specific questions about what was found during the examination.  If the procedure report does not answer your questions, please call your gastroenterologist to clarify.  If you requested that your care partner not be given the details of your procedure findings, then the procedure report has been included in a sealed envelope for you to review at your convenience later.  YOU SHOULD EXPECT: Some feelings of bloating in the abdomen. Passage of more gas than usual.  Walking can help get rid of the air that was put into your GI tract during the procedure and reduce the bloating. If you had a lower endoscopy (such as a colonoscopy or flexible sigmoidoscopy) you may notice spotting of blood in your stool or on the toilet paper. If you underwent a bowel prep for your procedure, you may not have a normal bowel movement for a few days.  Please Note:  You might notice some irritation and congestion in your nose or some drainage.  This is from the oxygen used during your procedure.  There is no need for concern and it should clear up in a day or so.  SYMPTOMS TO REPORT IMMEDIATELY:   Following lower endoscopy (colonoscopy or flexible sigmoidoscopy):  Excessive amounts of blood in the stool  Significant tenderness or worsening of abdominal pains  Swelling of the abdomen that is new, acute  Fever of 100F or higher   For urgent or emergent issues, a gastroenterologist can be reached at any hour by calling [phone].   DIET:  We do recommend a  small meal at first, but then you may proceed to your regular diet.  Drink plenty of fluids but you should avoid alcoholic beverages for 24 hours.  ACTIVITY:  You should plan to take it easy for the rest of today and you should NOT DRIVE or use heavy machinery until tomorrow (because of the sedation medicines used during the test).    FOLLOW UP: Our staff will call the number listed on your records 48-72 hours following your procedure to check on you and address any questions or concerns that you may have regarding the information given to you following your procedure. If we do not reach you, we will leave a message.  We will attempt to reach you two times.  During this call, we will ask if you have developed any symptoms of COVID 19. If you develop any symptoms (ie: fever, flu-like symptoms, shortness of breath, cough etc.) before then, please call [phone].  If you test positive for Covid 19 in the 2 weeks post procedure, please call and report this information to [name].    If any biopsies were taken you will be contacted by phone or by letter within the next 1-3 weeks.  Please call us at [phone] if you have not heard about the biopsies in 3 weeks.    SIGNATURES/CONFIDENTIALITY: You and/or your care partner have signed paperwork which will be entered into your electronic medical record.  These signatures attest to the fact that that the information above on your After Visit Summary has been reviewed and is understood.  Full responsibility of the confidentiality of this discharge information lies with you and/or your care-partner.

## 2019-04-16 NOTE — Op Note (Signed)
[facility] Patient Name: Jeffery Phelps Procedure Date: 04/16/2019 7:15 AM MRN: [id number] Endoscopist: Gatha Mayer , MD Age: 58 Referring MD:  Date of Birth: Dec 09, 1960 Gender: Male Account #: [id number] Procedure:                Colonoscopy Indications:              Screening in patient at increased risk: Colorectal                            cancer in mother before age 93 Medicines:                Propofol per Anesthesia, Monitored Anesthesia Care Procedure:                Pre-Anesthesia Assessment:                           - Prior to the procedure, a History and Physical                            was performed, and patient medications and                            allergies were reviewed. The patient's tolerance of                            previous anesthesia was also reviewed. The risks                            and benefits of the procedure and the sedation                            options and risks were discussed with the patient.                            All questions were answered, and informed consent                            was obtained. Prior Anticoagulants: The patient has                            taken no previous anticoagulant or antiplatelet                            agents. ASA Grade Assessment: II - A patient with                            mild systemic disease. After reviewing the risks                            and benefits, the patient was deemed in                            satisfactory condition to undergo the procedure.  After obtaining informed consent, the colonoscope                            was passed under direct vision. Throughout the                            procedure, the patient's blood pressure, pulse, and                            oxygen saturations were monitored continuously. The                            Colonoscope was introduced through the anus and                            advanced  to the the cecum, identified by                            appendiceal orifice and ileocecal valve. The                            colonoscopy was performed without difficulty. The                            patient tolerated the procedure well. The quality                            of the bowel preparation was good. The ileocecal                            valve, appendiceal orifice, and rectum were                            photographed. The bowel preparation used was                            Miralax via split dose instruction. Scope In: 7:56:05 AM Scope Out: 8:10:05 AM Scope Withdrawal Time: 0 hours 10 minutes 42 seconds  Total Procedure Duration: 0 hours 14 minutes 0 seconds  Findings:                 The perianal and digital rectal examinations were                            normal.                           The entire examined colon appeared normal on direct                            and retroflexion views. Complications:            No immediate complications. Estimated Blood Loss:     Estimated blood loss: none. Impression:               - The entire examined colon is  normal on direct and                            retroflexion views.                           - No specimens collected. Recommendation:           - Patient has a contact number available for                            emergencies. The signs and symptoms of potential                            delayed complications were discussed with the                            patient. Return to normal activities tomorrow.                            Written discharge instructions were provided to the                            patient.                           - Resume previous diet.                           - Continue present medications.                           - Repeat colonoscopy in 5 years for screening                            purposes.                           - DIFFICULT IV ACCESS - USE VEIN FINDER TO  START Gatha Mayer, MD 04/16/2019 8:21:47 AM This report has been signed electronically.

## 2019-04-20 ENCOUNTER — Telehealth: Payer: Self-pay

## 2019-04-20 NOTE — Telephone Encounter (Signed)
  Follow up Call-  Call back number 04/16/2019  Post procedure Call Back phone  # [phone]  Permission to leave phone message Yes  Some recent data might be hidden  1.  Have you developed a fever since your procedure? No  2.   Have you had an respiratory symptoms (SOB or cough) since your procedure? no  3.   Have you tested positive for COVID 19 since your procedure no  4.   Have you had any family members/close contacts diagnosed with the COVID 19 since your procedure?  no   If yes to any of these questions please route to Joylene John, RN and Alphonsa Gin, Therapist, sports.   Patient questions:  Do you have a fever, pain , or abdominal swelling? No. Pain Score  0 *  Have you tolerated food without any problems? Yes.    Have you been able to return to your normal activities? Yes.    Do you have any questions about your discharge instructions: Diet   No. Medications  No. Follow up visit  No.  Do you have questions or concerns about your Care? No.  Actions: * If pain score is 4 or above: No action needed, pain <4.   No problems noted per pt.

## 2024-01-02 ENCOUNTER — Ambulatory Visit (INDEPENDENT_AMBULATORY_CARE_PROVIDER_SITE_OTHER): Payer: BC Managed Care – PPO

## 2024-05-14 ENCOUNTER — Other Ambulatory Visit: Payer: Self-pay | Admitting: Neurology

## 2024-05-14 DIAGNOSIS — R413 Other amnesia: Secondary | ICD-10-CM

## 2024-05-16 ENCOUNTER — Ambulatory Visit
Admission: RE | Admit: 2024-05-16 | Discharge: 2024-05-16 | Disposition: A | Discharge status: Home / Self Care | Source: Ambulatory Visit | Attending: Neurology | Admitting: Neurology

## 2024-05-16 DIAGNOSIS — R413 Other amnesia: Secondary | ICD-10-CM | POA: Insufficient documentation

## 2024-05-29 ENCOUNTER — Encounter: Payer: Self-pay | Admitting: Internal Medicine

## 2024-06-27 ENCOUNTER — Emergency Department (HOSPITAL_BASED_OUTPATIENT_CLINIC_OR_DEPARTMENT_OTHER)
Admission: EM | Admit: 2024-06-27 | Discharge: 2024-06-27 | Disposition: A | Discharge status: Home / Self Care | Source: Ambulatory Visit | Attending: Emergency Medicine | Admitting: Emergency Medicine

## 2024-06-27 ENCOUNTER — Ambulatory Visit
Admission: EM | Admit: 2024-06-27 | Discharge: 2024-06-27 | Disposition: A | Discharge status: Home / Self Care | Attending: Family Medicine | Admitting: Family Medicine

## 2024-06-27 ENCOUNTER — Emergency Department (HOSPITAL_BASED_OUTPATIENT_CLINIC_OR_DEPARTMENT_OTHER): Admitting: Radiology

## 2024-06-27 DIAGNOSIS — S61210A Laceration without foreign body of right index finger without damage to nail, initial encounter: Secondary | ICD-10-CM | POA: Diagnosis not present

## 2024-06-27 DIAGNOSIS — S62631A Displaced fracture of distal phalanx of left index finger, initial encounter for closed fracture: Secondary | ICD-10-CM | POA: Insufficient documentation

## 2024-06-27 DIAGNOSIS — S61311A Laceration without foreign body of left index finger with damage to nail, initial encounter: Secondary | ICD-10-CM | POA: Diagnosis not present

## 2024-06-27 DIAGNOSIS — Z23 Encounter for immunization: Secondary | ICD-10-CM | POA: Diagnosis not present

## 2024-06-27 DIAGNOSIS — W268XXA Contact with other sharp object(s), not elsewhere classified, initial encounter: Secondary | ICD-10-CM | POA: Insufficient documentation

## 2024-06-27 DIAGNOSIS — M79644 Pain in right finger(s): Secondary | ICD-10-CM | POA: Diagnosis not present

## 2024-06-27 DIAGNOSIS — S60941A Unspecified superficial injury of left index finger, initial encounter: Secondary | ICD-10-CM | POA: Diagnosis present

## 2024-06-27 DIAGNOSIS — S62639B Displaced fracture of distal phalanx of unspecified finger, initial encounter for open fracture: Secondary | ICD-10-CM

## 2024-06-27 MED ORDER — CEPHALEXIN 500 MG PO CAPS: 500.0000 mg | ORAL_CAPSULE | Freq: Two times a day (BID) | ORAL | 0 refills | Status: DC

## 2024-06-27 MED ORDER — OXYCODONE HCL 5 MG PO TABS: 5.0000 mg | ORAL_TABLET | Freq: Four times a day (QID) | ORAL | 0 refills | Status: DC | PRN

## 2024-06-27 MED ORDER — LIDOCAINE HCL (PF) 1 % IJ SOLN
10.0000 mL | Freq: Once | INTRAMUSCULAR | Status: AC
  Administered 2024-06-27: 10 mL
  Filled 2024-06-27: qty 10

## 2024-06-27 MED ORDER — CEPHALEXIN 250 MG PO CAPS
500.0000 mg | ORAL_CAPSULE | Freq: Once | ORAL | Status: AC
  Administered 2024-06-27: 500 mg via ORAL
  Filled 2024-06-27: qty 2

## 2024-06-27 MED ORDER — TETANUS-DIPHTH-ACELL PERTUSSIS 5-2.5-18.5 LF-MCG/0.5 IM SUSY
0.5000 mL | PREFILLED_SYRINGE | Freq: Once | INTRAMUSCULAR | Status: AC
  Administered 2024-06-27: 0.5 mL via INTRAMUSCULAR
  Filled 2024-06-27: qty 0.5

## 2024-06-27 MED ORDER — IBUPROFEN 400 MG PO TABS
600.0000 mg | ORAL_TABLET | Freq: Once | ORAL | Status: AC
  Administered 2024-06-27: 600 mg via ORAL
  Filled 2024-06-27: qty 1

## 2024-06-27 NOTE — ED Notes (Signed)
 Pt given discharge instructions and reviewed prescriptions and laceration care. Opportunities given for questions. Pt verbalizes understanding. Bethena Powell SAUNDERS, RN

## 2024-06-27 NOTE — ED Notes (Signed)
 Patient is being discharged from the Urgent Care and sent to the Emergency Department via POV . Per [place], [place], patient is in need of higher level of care due to finger laceration. Patient is aware and verbalizes understanding of plan of care.  Vitals:   06/27/24 0842  BP: (!) 172/79  Pulse: 75  Resp: 18  Temp: 98.3 F (36.8 C)  SpO2: 95%

## 2024-06-27 NOTE — Discharge Instructions (Addendum)
 You had 14 non-absorbable sutures placed today (10 in the finger pad and 4 in the nail). You also had 2 absorbable sutures placed under the nail. You must get your sutures rechecked for possible removal in 10 days back in the ER.   You may gently clean the area around your laceration as needed with water. Place antibiotic ointment such as bacitracin or neosporin over your laceration after cleaning the area.  Keep the laceration covered with antibiotic ointment such as Neosporin or bacitracin, and then sterile gauze as shown here.  Change this out twice daily.  You may pick these supplies up at any drugstore.  Do not submerge your laceration in water (no baths, swimming) until it is fully healed. You may shower.   You have an underlying fracture of the distal phalanx of the right index finger.  You are placed in a finger splint to hold this in place. Leave this on at all times aside from washing your hands or showering.   You need to follow-up with the hand specialist, Dr. Alyse, within the next week for follow-up.  His contact information is listed below.  Call his office to schedule an appointment.  You have been prescribed antibiotic called Keflex  to prevent infection in your finger.  You were given your first dose here today.  Take your next dose this evening.  Take this antibiotic 2 times a day for a full 7 days.   You may take up to 1000mg  of tylenol  every 6 hours as needed for pain. Do not take more then 4g per day.   You may use up to 500mg  of naproxen  every 12 hours as needed for pain.  You have been prescribed Oxycodone -this is a narcotic/controlled substance medication that has potential addicting qualities.  You may take 1 tablet every 6 hours as needed for severe pain.  Do not drive or operate heavy machinery when taking this medicine as it can be sedating. Do not drink alcohol or take other sedating medications when taking this medicine for safety reasons.  Keep this out of reach of  small children.     Return to the ER should you develop fever, chills, pus drainage from your wound, redness around your wound, severe pain in your finger, any other new or concerning symptoms

## 2024-06-27 NOTE — Discharge Instructions (Addendum)
 You have a severe laceration that requires a higher level of laceration repair than we can perform in the urgent care setting.  This includes thorough and sterile wound irrigation given the depth and severity of your wound on your finger or consideration for subcutaneous sutures.  Please head straight to the emergency room.
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
## 2024-06-27 NOTE — ED Provider Notes (Signed)
 [place] [facility] AT [facility] Provider Note   CSN: [id number] Arrival date & time: 06/27/24  9070     Patient presents with: Laceration   Jeffery Phelps is a 63 y.o. left-hand-dominant male with no significant past medical history presents with concern for a laceration to the right index finger that occurred just prior to arrival.  States he was unloading a truck bed when his finger got cut on a piece of metal.  Bleeding well-controlled upon arrival.  Denies any loss of sensation in the finger or any difficulty with movement of the finger.  He reports his last tetanus was in 2012.    Laceration      Prior to Admission medications   Medication Sig Start Date End Date Taking? Authorizing Provider  cephALEXin  (KEFLEX ) 500 MG capsule Take 1 capsule (500 mg total) by mouth 2 (two) times daily. 06/27/24  Yes Veta Palma, PA-C  oxyCODONE  (ROXICODONE ) 5 MG immediate release tablet Take 1 tablet (5 mg total) by mouth every 6 (six) hours as needed for severe pain (pain score 7-10) or breakthrough pain (pain not controlled with naproxen  and tylenol ). 06/27/24  Yes Veta Palma, PA-C  cetirizine (ZYRTEC) 10 MG tablet Take 10 mg by mouth daily.    [provider]  Famotidine (PEPCID AC PO) Take by mouth 3 (three) times daily.    [provider]  glucosamine-chondroitin 500-400 MG tablet Take 2 tablets by mouth daily.    [provider]  loperamide (IMODIUM A-D) 2 MG tablet Take 4 mg by mouth daily.    [provider]  MAGNESIUM PO Take by mouth daily.    [provider]  Multiple Vitamin (ONE-A-DAY MENS PO) Take 1 tablet by mouth daily.    [provider]  naproxen  sodium (ANAPROX ) 220 MG tablet Take 440 mg by mouth daily.     [provider]  OVER THE COUNTER MEDICATION Super Beta Prostate P-3 Advanced twice a day    [provider]  PRESCRIPTION MEDICATION M-Drive for prostrate 2 times a day     [provider]    Allergies: Patient has no active allergies.    Review of Systems  Skin:  Positive for wound.    Updated Vital Signs BP (!) 169/90 (BP Location: Right Arm)   Pulse 67   Temp 98 F (36.7 C) (Oral)   Resp 18   SpO2 96%   Physical Exam Vitals and nursing note reviewed.  Constitutional:      Appearance: Normal appearance.  HENT:     Head: Atraumatic.  Cardiovascular:     Comments: Cap refill of the right index finger less than 2 seconds.  Radial pulse 2+ bilaterally Pulmonary:     Effort: Pulmonary effort is normal.  Musculoskeletal:     Comments: Right index finger:  General 3 cm laceration that extends from over the pulp of the distal phalanx down to about the DIP joint.  It extends underneath the nailbed, but no laceration of the nail itself. Bleeding well-controlled upon arrival.  No obvious foreign body.  Not able to visualize any bone on examination.  ROM Full flexion and extension at the right index finger MCP, PIP, DIP joints    Neurological:     General: No focal deficit present.     Mental Status: He is alert.     Comments: Sensation intact throughout the right index finger  Psychiatric:        Mood and Affect: Mood  normal.        Behavior: Behavior normal.      (all labs ordered are listed, but only abnormal results are displayed) Labs Reviewed - No data to display  EKG: None  Radiology: DG Finger Index Right Result Date: 06/27/2024 EXAM: 2 VIEW(S) XRAY OF THE RIGHT FINGER 06/27/2024 09:57:47 AM COMPARISON: None available. CLINICAL HISTORY: Laceration to right index finger. FINDINGS: BONES AND JOINTS: Transverse fracture of the tuft of the distal phalanx, distracted 3 mm and displaced 3 mm anteriorly. No radiodense foreign body. SOFT TISSUES: Overlying soft tissue defect. IMPRESSION: 1. Displaced Tuft fracture, probably open. Electronically signed by: Dayne Hassell MD 06/27/2024 10:04 AM EDT RP Workstation: [id number]      .Laceration Repair  Date/Time: 06/27/2024 1:12 PM  Performed by: Veta Palma, PA-C Authorized by: Veta Palma, PA-C   Consent:    Consent obtained:  Verbal   Consent given by:  Patient   Risks, benefits, and alternatives were discussed: yes     Risks discussed:  Infection, pain, poor cosmetic result, poor wound healing and retained foreign body   Alternatives discussed:  No treatment Universal protocol:    Procedure explained and questions answered to patient or proxy's satisfaction: yes     Imaging studies available: yes     Patient identity confirmed:  Verbally with patient Anesthesia:    Anesthesia method:  Nerve block   Block needle gauge:  25 G   Block anesthetic:  Lidocaine  1% w/o epi (8ml)   Block injection procedure:  Introduced needle, incremental injection, negative aspiration for blood, anatomic landmarks identified and anatomic landmarks palpated   Block outcome:  Anesthesia achieved Laceration details:    Location:  Finger   Finger location:  R index finger   Length (cm):  3 Pre-procedure details:    Preparation:  Imaging obtained to evaluate for foreign bodies and patient was prepped and draped in usual sterile fashion Exploration:    Imaging obtained: x-ray     Imaging outcome: foreign body not noted     Wound exploration: wound explored through full range of motion and entire depth of wound visualized     Wound extent: underlying fracture     Wound extent: no foreign body, no nerve damage and no tendon damage     Contaminated: no   Treatment:    Area cleansed with:  Chlorhexidine   Amount of cleaning:  Extensive   Irrigation solution:  Sterile saline   Irrigation volume:  2L   Irrigation method:  Pressure wash   Visualized foreign bodies/material removed: yes     Debridement:  None   Undermining:  Extensive Skin repair:    Repair method:  Sutures   Suture size:  5-0 and 4-0   Suture material:  Prolene and chromic gut   Suture  technique:  Simple interrupted   Number of sutures:  14 (10 5-0 prolene sutures in pad of finger, 4 4-0 prolene to secure nail down after re-insertion, 2 4-0 chromic gut under nailbed) Repair type:    Repair type:  Complex Post-procedure details:    Dressing:  Antibiotic ointment, non-adherent dressing, splint for protection and sterile dressing   Procedure completion:  Tolerated well, no immediate complications Nail Removal  Date/Time: 06/27/2024 1:20 PM  Performed by: Veta Palma, PA-C Authorized by: Veta Palma, PA-C   Consent:    Consent obtained:  Verbal   Consent given by:  Patient   Risks, benefits, and alternatives were discussed: yes  Risks discussed:  Bleeding, incomplete removal, infection and pain   Alternatives discussed:  No treatment and alternative treatment Universal protocol:    Procedure explained and questions answered to patient or proxy's satisfaction: yes     Test results available: yes     Patient identity confirmed:  Verbally with patient Pre-procedure details:    Skin preparation:  Chlorhexidine   Preparation: Patient was prepped and draped in the usual sterile fashion   Procedure details:    Location:  Hand   Hand location:  R index finger Anesthesia:    Anesthesia method:  Nerve block   Block needle gauge:  25 G   Block anesthetic:  Lidocaine  1% w/o epi   Block injection procedure:  Anatomic landmarks identified, introduced needle, incremental injection, negative aspiration for blood and anatomic landmarks palpated   Block outcome:  Anesthesia achieved Nail Removal:    Nail removed:  Complete   Removed nail replaced and anchored: yes     Stented with:  4 simple interrupted 4-0 suture Post-procedure details:    Dressing:  Antibiotic ointment and splint   Procedure completion:  Tolerated well, no immediate complications Comments:     Patient's nail removed and then reattached with 4, 4-0 simple interrupted Prolene sutures in order to  repair the underlying laceration    Medications Ordered in the ED  lidocaine  (PF) (XYLOCAINE ) 1 % injection 10 mL (10 mLs Infiltration Given 06/27/24 1010)  Tdap (BOOSTRIX ) injection 0.5 mL (0.5 mLs Intramuscular Given 06/27/24 1010)  ibuprofen  (ADVIL ) tablet 600 mg (600 mg Oral Given 06/27/24 1013)  cephALEXin  (KEFLEX ) capsule 500 mg (500 mg Oral Given 06/27/24 1348)                                    Medical Decision Making Amount and/or Complexity of Data Reviewed Radiology: ordered.  Risk Prescription drug management.    Differential diagnosis includes but is not limited to laceration, wound infection, tendon injury, nerve injury, vascular injury, retained foreign body, fracture, dislocation  ED Course:  Upon initial evaluation, patient is well-appearing, no acute distress.  Sustained a laceration over the right index finger distal phalanx.  Bleeding well-controlled upon arrival.  Laceration approximately 3 cm in size.  It does extend up under the nailbed. No visualized foreign body or bone on initial examination.  Patient has full flexion and extension of the right index finger MCP, PIP, DIP, no concern for tendon injury at this time.  He has cap refill in the right index finger that is less than 2 seconds, and has intact sensation to the right index finger.  No concern for a vascular or nerve injury at this time.    Imaging Studies ordered: I ordered imaging studies including x-ray right index finger I independently visualized the imaging with scope of interpretation limited to determining acute life threatening conditions related to emergency care. Imaging showed   BONES AND JOINTS: Transverse fracture of the tuft of the distal phalanx, distracted 3 mm and displaced 3 mm anteriorly. No radiodense foreign body.  I agree with the radiologist interpretation   Medications Given: Ibuprofen  Tdap Lidocaine  Keflex   Imaging was reviewed which revealed no retained foreign  body, but did show a transverse fracture of the tuft of the distal phalanx.  Given overlying laceration, there is concern for open fracture.  My attending Dr. Yolande personally evaluated patient and recommends removal of the nail to repair  laceration that extends under the nail. He also recommends starting patient on keflex  given concern for open fracture. Patient's wound was irrigated well with 2L sterile saline and cleaned with chlorhexidine swabs. Anesthesia achieved with digital nerve block of the right index finger. Patient's laceration was repaired with 10, 5-0 simple interrupted Prolene sutures on the pad of the right index finger.  Laceration did extend under the nailbed so the nail was successfully removed and underlying defect repaired with 2, 4-0 chromic gut simple interrupted sutures.  The nail was then reinserted back into the nailbed successfully and 4, 4-0 simple interrupted Prolene sutures were placed to hold the nail in place.  Patient tolerated this well without any immediate complications. Suture repair occurred less than 24 hours after initial injury. Patient remains neurovascularly intact after suture placement. Patient's Tdap was updated.  He was given his first dose of Keflex  here today.  Patient's finger was wrapped with antibiotics Xeroform gauze and placed in a finger splint.  Patient stable and appropriate for discharge home.     Impression: Right index finger laceration Right index finger displaced distal phalanx fracture, likely open fracture  Disposition:  Patient discharged home with instructions to keep laceration site clean with gentle soap and water. Apply neosporin or bacitracin over the area as shown here and keep covered with sterile dressing and the finger splint. Return to ER in 10 days for suture/staple removal. Tylenol  and ibuprofen  as needed for pain.  He was prescribed oxycodone  for breakthrough pain at home.  He understands this may make him drowsy and do not  drink alcohol or drive to taking this medication.  Follow-up with orthopedics Dr. Alyse within the next week for follow-up regarding his fracture. Return precautions given.   This chart was dictated using voice recognition software, Dragon. Despite the best efforts of this provider to proofread and correct errors, errors may still occur which can change documentation meaning.       Final diagnoses:  Laceration of left index finger without foreign body with damage to nail, initial encounter  Open displaced fracture of distal phalanx of finger of left hand    ED Discharge Orders          Ordered    oxyCODONE  (ROXICODONE ) 5 MG immediate release tablet  Every 6 hours PRN        06/27/24 1325    cephALEXin  (KEFLEX ) 500 MG capsule  2 times daily        06/27/24 1330               Veta Palma, PA-C 06/27/24 1351    Yolande Lamar BROCKS, MD 06/29/24 [phone]

## 2024-06-27 NOTE — ED Triage Notes (Signed)
 Reports lac to R index finger. Bleeding controlled.  Tdap in 2012.

## 2024-06-27 NOTE — ED Triage Notes (Addendum)
 Pt reports laceration in the ring index finger 15-20 min ago when he was trying to load a trailer and finger got cut and pinched with a piece of metal.   Pt reports last Tdap around 2012-2013.

## 2024-10-06 ENCOUNTER — Ambulatory Visit

## 2024-10-06 VITALS — Ht 72.0 in | Wt 329.0 lb

## 2024-10-06 DIAGNOSIS — Z800 Family history of malignant neoplasm of digestive organs: Secondary | ICD-10-CM

## 2024-10-06 MED ORDER — NA SULFATE-K SULFATE-MG SULF 17.5-3.13-1.6 GM/177ML PO SOLN: 1.0000 | Freq: Once | ORAL | 0 refills | Status: AC

## 2024-10-06 NOTE — Progress Notes (Signed)
 No egg or soy allergy known to patient  No issues known to pt with past sedation with any surgeries or procedures Patient denies ever being told they had issues or difficulty with intubation  No FH of Malignant Hyperthermia Pt is not on diet pills Pt is not on  home 02  Pt is not on blood thinners  Pt denies issues with constipation  No A fib or A flutter Have any cardiac testing pending--NO Pt can ambulate- Independently Pt denies use of chewing tobacco Discussed diabetic I weight loss medication holds Discussed NSAID holds Checked BMI Pt instructed to use Singlecare.com or GoodRx for a price reduction on prep  Patient's chart reviewed by Cathlyn Parsons CNRA prior to previsit and patient appropriate for the [facility].  Pre visit completed and red dot placed by patient's name on their procedure day (on provider's schedule).

## 2024-10-12 ENCOUNTER — Encounter: Payer: Self-pay | Admitting: Internal Medicine
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
## 2024-10-19 ENCOUNTER — Encounter: Payer: Self-pay | Admitting: Internal Medicine

## 2024-10-19 ENCOUNTER — Ambulatory Visit (AMBULATORY_SURGERY_CENTER): Admitting: Internal Medicine

## 2024-10-19 VITALS — BP 117/61 | HR 59 | Temp 98.4°F | Resp 14 | Ht 72.0 in | Wt 329.0 lb

## 2024-10-19 DIAGNOSIS — Z789 Other specified health status: Secondary | ICD-10-CM

## 2024-10-19 DIAGNOSIS — Z1211 Encounter for screening for malignant neoplasm of colon: Secondary | ICD-10-CM

## 2024-10-19 DIAGNOSIS — Z800 Family history of malignant neoplasm of digestive organs: Secondary | ICD-10-CM

## 2024-10-19 MED ORDER — SODIUM CHLORIDE 0.9 % IV SOLN: 500.0000 mL | Freq: Once | INTRAVENOUS | Status: DC

## 2024-10-19 NOTE — Op Note (Signed)
 [facility] Patient Name: Jeffery Phelps Procedure Date: 10/19/2024 7:17 AM MRN: [id number] Endoscopist: Lupita FORBES Commander , MD, [id number] Age: 63 Referring MD:  Date of Birth: 1961-08-27 Gender: Male Account #: [id number] Procedure:                Colonoscopy Indications:              Screening in patient at increased risk: Colorectal                            cancer in mother before age 62 Medicines:                Monitored Anesthesia Care Procedure:                Pre-Anesthesia Assessment:                           - Prior to the procedure, a History and Physical                            was performed, and patient medications and                            allergies were reviewed. The patient's tolerance of                            previous anesthesia was also reviewed. The risks                            and benefits of the procedure and the sedation                            options and risks were discussed with the patient.                            All questions were answered, and informed consent                            was obtained. Prior Anticoagulants: The patient has                            taken no anticoagulant or antiplatelet agents. ASA                            Grade Assessment: III - A patient with severe                            systemic disease. After reviewing the risks and                            benefits, the patient was deemed in satisfactory                            condition to undergo the procedure.  After obtaining informed consent, the colonoscope                            was passed under direct vision. Throughout the                            procedure, the patient's blood pressure, pulse, and                            oxygen saturations were monitored continuously. The                            Olympus Scope SN: [id number] was introduced through                            the anus and advanced to  the the cecum, identified                            by appendiceal orifice and ileocecal valve. The                            colonoscopy was performed without difficulty. The                            patient tolerated the procedure well. The quality                            of the bowel preparation was good. The ileocecal                            valve, appendiceal orifice, and rectum were                            photographed. The bowel preparation used was SUPREP                            via split dose instruction. Scope In: 8:51:03 AM Scope Out: 9:02:01 AM Scope Withdrawal Time: 0 hours 8 minutes 40 seconds  Total Procedure Duration: 0 hours 10 minutes 58 seconds  Findings:                 The perianal and digital rectal examinations were                            normal. Pertinent negatives include normal prostate                            (size, shape, and consistency).                           The entire examined colon appeared normal on direct                            and retroflexion views. Complications:  No immediate complications. Estimated Blood Loss:     Estimated blood loss: none. Impression:               - The entire examined colon is normal on direct and                            retroflexion views.                           - No specimens collected. Recommendation:           - Patient has a contact number available for                            emergencies. The signs and symptoms of potential                            delayed complications were discussed with the                            patient. Return to normal activities tomorrow.                            Written discharge instructions were provided to the                            patient.                           - Resume previous diet.                           - Continue present medications.                           - Repeat colonoscopy in 5 years for screening                             purposes. Lupita FORBES Commander, MD 10/19/2024 9:11:18 AM This report has been signed electronically.

## 2024-10-19 NOTE — Progress Notes (Signed)
 Report given to PACU, vss

## 2024-10-19 NOTE — Progress Notes (Signed)
 Pt's states no medical or surgical changes since previsit or office visit.

## 2024-10-19 NOTE — Patient Instructions (Addendum)
 Normal colonoscopy again.  No polyps or cancer were seen.  Routine repeat colonoscopy in 5 years given family history of colon cancer.  I appreciate the opportunity to care for you. Lupita CHARLENA Commander, MD, FACG   YOU HAD AN ENDOSCOPIC PROCEDURE TODAY AT THE [facility]:   Refer to the procedure report that was given to you for any specific questions about what was found during the examination.  If the procedure report does not answer your questions, please call your gastroenterologist to clarify.  If you requested that your care partner not be given the details of your procedure findings, then the procedure report has been included in a sealed envelope for you to review at your convenience later.  YOU SHOULD EXPECT: Some feelings of bloating in the abdomen. Passage of more gas than usual.  Walking can help get rid of the air that was put into your GI tract during the procedure and reduce the bloating. If you had a lower endoscopy (such as a colonoscopy or flexible sigmoidoscopy) you may notice spotting of blood in your stool or on the toilet paper. If you underwent a bowel prep for your procedure, you may not have a normal bowel movement for a few days.  Please Note:  You might notice some irritation and congestion in your nose or some drainage.  This is from the oxygen used during your procedure.  There is no need for concern and it should clear up in a day or so.  SYMPTOMS TO REPORT IMMEDIATELY:  Following lower endoscopy (colonoscopy or flexible sigmoidoscopy):  Excessive amounts of blood in the stool  Significant tenderness or worsening of abdominal pains  Swelling of the abdomen that is new, acute  Fever of 100F or higher  For urgent or emergent issues, a gastroenterologist can be reached at any hour by calling [phone]. Do not use MyChart messaging for urgent concerns.    DIET:  We do recommend a small meal at first, but then you may proceed to your regular diet.   Drink plenty of fluids but you should avoid alcoholic beverages for 24 hours.  ACTIVITY:  You should plan to take it easy for the rest of today and you should NOT DRIVE or use heavy machinery until tomorrow (because of the sedation medicines used during the test).    FOLLOW UP: Our staff will call the number listed on your records the next business day following your procedure.  We will call around 7:15- 8:00 am to check on you and address any questions or concerns that you may have regarding the information given to you following your procedure. If we do not reach you, we will leave a message.     SIGNATURES/CONFIDENTIALITY: You and/or your care partner have signed paperwork which will be entered into your electronic medical record.  These signatures attest to the fact that that the information above on your After Visit Summary has been reviewed and is understood.  Full responsibility of the confidentiality of this discharge information lies with you and/or your care-partner.

## 2024-10-20 ENCOUNTER — Telehealth: Payer: Self-pay

## 2024-10-20 NOTE — Telephone Encounter (Signed)
" °  Follow up Call-     10/19/2024    7:48 AM  Call back number  Post procedure Call Back phone  # [phone]  Permission to leave phone message Yes     Patient questions:  Do you have a fever, pain , or abdominal swelling? No. Pain Score  0 *  Have you tolerated food without any problems? Yes.    Have you been able to return to your normal activities? Yes.    Do you have any questions about your discharge instructions: Diet   No. Medications  No. Follow up visit  No.  Do you have questions or concerns about your Care? No.  Actions: * If pain score is 4 or above: No action needed, pain <4.  Spoke on phone with wife Verneita.    "
